# Patient Record
Sex: Male | Born: 1965 | Race: White | Hispanic: No | Marital: Married | Smoking: Never smoker
Health system: Southern US, Community
[De-identification: ages and names within clinical notes are randomized; demographics above are authoritative.]

## PROBLEM LIST (undated history)

## (undated) DIAGNOSIS — I4819 Other persistent atrial fibrillation: Secondary | ICD-10-CM

## (undated) DIAGNOSIS — K219 Gastro-esophageal reflux disease without esophagitis: Secondary | ICD-10-CM

## (undated) DIAGNOSIS — K589 Irritable bowel syndrome without diarrhea: Secondary | ICD-10-CM

## (undated) DIAGNOSIS — I071 Rheumatic tricuspid insufficiency: Secondary | ICD-10-CM

## (undated) HISTORY — DX: Rheumatic tricuspid insufficiency: I07.1

## (undated) HISTORY — DX: Irritable bowel syndrome, unspecified: K58.9

## (undated) HISTORY — DX: Other persistent atrial fibrillation: I48.19

---

## 1996-07-01 HISTORY — PX: ANKLE FRACTURE SURGERY: SHX122

## 1996-07-01 HISTORY — PX: TIBIA FRACTURE SURGERY: SHX806

## 1997-12-21 ENCOUNTER — Emergency Department (HOSPITAL_COMMUNITY): Admission: EM | Admit: 1997-12-21 | Discharge: 1997-12-21 | Payer: Self-pay | Admitting: Emergency Medicine

## 1998-09-07 ENCOUNTER — Emergency Department (HOSPITAL_COMMUNITY): Admission: EM | Admit: 1998-09-07 | Discharge: 1998-09-07 | Payer: Self-pay | Admitting: Emergency Medicine

## 1998-09-07 ENCOUNTER — Encounter: Payer: Self-pay | Admitting: Emergency Medicine

## 1999-10-27 ENCOUNTER — Ambulatory Visit (HOSPITAL_COMMUNITY): Admission: RE | Admit: 1999-10-27 | Discharge: 1999-10-27 | Payer: Self-pay | Admitting: Gastroenterology

## 1999-10-27 ENCOUNTER — Encounter: Payer: Self-pay | Admitting: Gastroenterology

## 2004-09-12 ENCOUNTER — Emergency Department (HOSPITAL_COMMUNITY): Admission: EM | Admit: 2004-09-12 | Discharge: 2004-09-13 | Payer: Self-pay | Admitting: Emergency Medicine

## 2007-12-18 ENCOUNTER — Encounter: Admission: RE | Admit: 2007-12-18 | Discharge: 2007-12-18 | Payer: Self-pay | Admitting: Gastroenterology

## 2009-07-10 ENCOUNTER — Encounter: Admission: RE | Admit: 2009-07-10 | Discharge: 2009-07-10 | Payer: Self-pay | Admitting: Gastroenterology

## 2010-12-11 ENCOUNTER — Ambulatory Visit (HOSPITAL_COMMUNITY)
Admission: RE | Admit: 2010-12-11 | Discharge: 2010-12-11 | Disposition: A | Discharge status: Home / Self Care | Payer: PRIVATE HEALTH INSURANCE | Source: Ambulatory Visit | Attending: Cardiology | Admitting: Cardiology

## 2010-12-11 DIAGNOSIS — I4891 Unspecified atrial fibrillation: Secondary | ICD-10-CM | POA: Insufficient documentation

## 2010-12-11 DIAGNOSIS — Z7901 Long term (current) use of anticoagulants: Secondary | ICD-10-CM | POA: Insufficient documentation

## 2010-12-25 NOTE — Op Note (Signed)
  NAMEGLEASON, Chad Braun NO.:  [id number]  MEDICAL RECORD NO.:  [id number]  LOCATION:  [facility]                         FACILITY:  [facility]  PHYSICIAN:  Armanda Magic, M.D.     DATE OF BIRTH:  06/28/1966  DATE OF PROCEDURE:  12/11/2010 DATE OF DISCHARGE:  12/11/2010                              OPERATIVE REPORT   REFERRING PHYSICIAN:  Hessie Diener (C.Alan) Tenny Craw, MD  PROCEDURE:  Direct current cardioversion.  OPERATOR:  Armanda Magic, MD  INDICATIONS:  Atrial fibrillation off Pradaxa since October 03, 2010.  The patient states he has numbness in doses.  COMPLICATIONS:  None.  IV MEDICATIONS:  Propofol 160 mg.  This is a 45 year old male with a history of paroxysmal atrial fibrillation in the past usually only occurring during acute illnesses but recently has had recurrence of atrial fibrillation.  He was recently placed on Pradaxa 150 mg b.i.d. on October 03, 2010, and has not missed any doses.  He now presents for direct current cardioversion.  The patient is brought to the day hospital in a fasting nonsedated state.  Informed consent was obtained.  The patient was connected to continuous heart rate pulse oximetry monitoring and blood pressure monitoring.  Defibrillator pads were placed on the left anterior chest and back.  After adequate anesthesia was obtained, a 150 joule synchronized biphasic shock was delivered which successfully converted the patient to sinus rhythm.  Few minutes after conversion, the patient then reverted back to atrial fibrillation.  The patient tolerated the procedure well without complications.  ASSESSMENT: 1. Atrial fibrillation. 2. Systemic anticoagulation on Pradaxa x 2 months. 3. Successful cardioversion to sinus rhythm with revision back to     atrial fibrillation several months later.  PLAN:  We will continue metoprolol and Pradaxa.  We will get an outpatient nuclear stress test in 1 week to rule out ischemia.  The patient only had an EPT  back in December 2011, and before placing on flecainide would like to get a nuclear stress test to make sure there is normal perfusion.  Nuclear stress test was normal.  We will start flecainide 50 mg b.i.d.  He will follow up with me in 2 weeks.     Armanda Magic, M.D.     TT/MEDQ  D:  12/11/2010  T:  12/12/2010  Job:  [id number]  cc:   Magnus Sinning) Tenny Craw, M.D.  Electronically Signed by Armanda Magic M.D. on 12/25/2010 09:59:06 PM

## 2011-01-21 ENCOUNTER — Inpatient Hospital Stay (HOSPITAL_BASED_OUTPATIENT_CLINIC_OR_DEPARTMENT_OTHER)
Admission: RE | Admit: 2011-01-21 | Discharge: 2011-01-21 | Disposition: A | Discharge status: Home / Self Care | Payer: PRIVATE HEALTH INSURANCE | Source: Ambulatory Visit | Attending: Cardiology | Admitting: Cardiology

## 2011-01-21 HISTORY — PX: CARDIAC CATHETERIZATION: SHX172

## 2011-01-31 NOTE — Cardiovascular Report (Signed)
NAMESHIVAAY, STORMONT             ACCOUNT NO.:  [id number]  MEDICAL RECORD NO.:  [id number]  LOCATION:                                 FACILITY:  PHYSICIAN:  Armanda Magic, M.D.     DATE OF BIRTH:  1966-02-22  DATE OF PROCEDURE:  01/21/2011 DATE OF DISCHARGE:                           CARDIAC CATHETERIZATION   PROCEDURES: 1. Left heart catheterization. 2. Coronary angiography. 3. Left ventriculography.  OPERATOR:  Armanda Magic, MD  MEDICATIONS:  Abnormal nuclear stress test done for drug-eluting flecainide.  INTRAVENOUS MEDICATIONS:  Versed 2 mg and fentanyl 25 mcg.  COMPLICATIONS:  None.  INTRAVENOUS ACCESS:  Via right femoral artery 4-French sheath.  This is a 45 year old white male with a history of atrial fibrillation who has undergone cardioversion with recurrence of atrial fibrillation. We are now contemplating adding flecainide.  He underwent nuclear stress test prior to adding flecainide which showed a reversible defect in the apex.  He now presents for cardiac catheterization.  The patient was brought to the Cardiac Catheterization Laboratory in a fasting nonsedated state.  Informed consent was obtained.  The patient was connected to continuous heart rate and pulse oximetry monitoring and intermittent blood pressure monitoring.  The right groin was prepped and draped in a sterile fashion.  Xylocaine 1% was used for local anesthesia.  Using modified Seldinger technique, a 4-French sheath was placed in the right femoral artery.  Under fluoroscopic guidance, a 4- [place] JL-4 catheter was placed in the left coronary artery.  Multiple cine films were taken at 30-degree RAO and 40-degree LAO views.  This catheter was then exchanged out over a guidewire for 4-French 3-D RCA catheter which successfully engaged the right coronary ostium.  Multiple cine films were taken at 30-degree RAO and 40-degree LAO views.  This catheter was then exchanged over a guidewire for a  4-French angled pigtail catheter which was placed under fluoroscopic guidance in the left ventricular cavity.  Left ventriculography was performed in 30- degree RAO view using a total of 25 mL of contrast at 12 mL per second. The catheter was then pulled back across the aortic valve with no significant gradient noted.  At the end of the procedure, all catheters and sheaths were removed.  Manual compression was performed until adequate hemostasis was obtained.  The patient was transferred back to room in stable condition.  RESULTS: 1. Left main coronary artery is actually a dual-ostial LAD and left     circumflex.  Left anterior descending artery is widely patent     throughout its course of the apex.  It gives rise to a first     diagonal which is very large and widely patent and bifurcates into     2 daughter vessels.  It then gives rise to a second diagonal which     is widely patent and a third diagonal which is widely patent as     well.  The left circumflex is widely patent throughout its course     and distally bifurcates into 2 daughter vessels both of which are     widely patent.  The right coronary artery is widely patent and bifurcates distally into a posterior descending  artery and a posterolateral artery both of which are widely patent.  Left ventriculography shows questionable mild LV dysfunction, EF of around 50, but the patient was in AFib going very fast at 120 beats per minute.  It is difficult to assess LV function. 1. Normal coronary artery.  PLAN:  We will continue Toprol and add digoxin 0.125 mg daily for rate control.  I will get an outpatient echo to reassess LV function before starting flecainide.  He will restart his Pradaxa today.     Armanda Magic, M.D.     TT/MEDQ  D:  01/21/2011  T:  01/21/2011  Job:  [id number]  cc:   Magnus Sinning) Tenny Craw, M.D.  Electronically Signed by Armanda Magic M.D. on 01/31/2011 08:53:23 AM

## 2011-02-18 ENCOUNTER — Encounter: Payer: Self-pay | Admitting: Internal Medicine

## 2011-02-19 ENCOUNTER — Ambulatory Visit (INDEPENDENT_AMBULATORY_CARE_PROVIDER_SITE_OTHER): Payer: PRIVATE HEALTH INSURANCE | Admitting: Internal Medicine

## 2011-02-19 ENCOUNTER — Encounter: Payer: Self-pay | Admitting: Internal Medicine

## 2011-02-19 VITALS — BP 114/72 | HR 86 | Ht 70.0 in | Wt 212.0 lb

## 2011-02-19 DIAGNOSIS — I519 Heart disease, unspecified: Secondary | ICD-10-CM

## 2011-02-19 DIAGNOSIS — I4891 Unspecified atrial fibrillation: Secondary | ICD-10-CM

## 2011-02-19 DIAGNOSIS — Z7901 Long term (current) use of anticoagulants: Secondary | ICD-10-CM

## 2011-02-19 MED ORDER — FLECAINIDE ACETATE 100 MG PO TABS: 100.0000 mg | ORAL_TABLET | Freq: Two times a day (BID) | ORAL | Status: DC

## 2011-02-19 NOTE — Assessment & Plan Note (Signed)
The patient is symptomatic. His [place] score is 0. He has an EF=50% by echo. He has no CHF symptoms. I have reviewed his ECG and have recommended starting flecainide 100 mg twice daily, and stopping digoxin. He will undergo exercise treadmill testing in 2 weeks. I have asked that he not undergo any strenuous activity until he has undergone stress testing. He would be a good candidate for atrial fib ablation but this would only be indicated if he fails an anti-arrhythmic drug. Once we have gotten him back into NSR, I would stop pradaxa after 3-4 weeks.

## 2011-02-19 NOTE — Patient Instructions (Addendum)
Your physician has recommended you make the following change in your medication: Stop your Digoxin, continue your Metoprolol and start Flecanide 100mg  twice daily  Your physician has requested that you have an exercise tolerance test. For further information please visit [email or web address] Please also follow instruction sheet, as given.

## 2011-02-19 NOTE — Progress Notes (Signed)
HPI Chad Braun is referred today by Dr. Mayford Knife for evaluation of persistent atrial fibrillation. The patient is otherwise healthy. He has a h/o atrial fib dating back to 1998 when he fractured his leg. He had no recurrence until 14 months ago when he spontaneously converted on toprol. In April, he had recurrent fibrillation and experienced ERAF after DCCV. He was noted to have mild LV dysfunction by cath and echo (EF=50%) and no CAD. He denies CHF symptoms. He has never had syncope. He notes that in atrial fib, he gets tired easier and cannot perform strenuous activity. He denies peripheral edema. After uptitration of his toprol, his palpitations have improved. No Known Allergies   Current Outpatient Prescriptions  Medication Sig Dispense Refill  . dabigatran (PRADAXA) 150 MG CAPS Take 150 mg by mouth 2 (two) times daily.        . digoxin (LANOXIN) 0.125 MG tablet Take 125 mcg by mouth daily.        Marland Kitchen omeprazole (PRILOSEC) 20 MG capsule Take 20 mg by mouth 2 (two) times daily.        . metoprolol (TOPROL-XL) 50 MG 24 hr tablet Take 50 mg by mouth Daily.         Past Medical History  Diagnosis Date  . Atrial fibrillation   . IBS (irritable bowel syndrome)   . Mild tricuspid regurgitation     ROS:   All systems reviewed and negative except as noted in the HPI.   Past Surgical History  Procedure Date  . Cardioversion      History reviewed. No pertinent family history.   History   Social History  . Marital Status: Married    Spouse Name: N/A    Number of Children: N/A  . Years of Education: N/A   Occupational History  .      Engineer, structural   Social History Main Topics  . Smoking status: Never Smoker   . Smokeless tobacco: Not on file  . Alcohol Use: Yes     one per week  . Drug Use: No  . Sexually Active: Not on file   Other Topics Concern  . Not on file   Social History Narrative  . No narrative on file     BP 114/72  Pulse 86  Ht 5'  10" (1.778 m)  Wt 212 lb (96.163 kg)  BMI 30.42 kg/m2  Physical Exam:  Well appearing NAD HEENT: Unremarkable Neck:  No JVD, no thyromegally Lymphatics:  No adenopathy Back:  No CVA tenderness Lungs:  Clear with no wheezes, rales, or rhonchi HEART:  Iregular rate rhythm, no murmurs, no rubs, no clicks Abd:  soft, positive bowel sounds, no organomegally, no rebound, no guarding Ext:  2 plus pulses, no edema, no cyanosis, no clubbing Skin:  No rashes no nodules Neuro:  CN II through XII intact, motor grossly intact  EKG Atrial fibrillation with a controlled ventricular response  Assess/Plan:

## 2011-03-14 ENCOUNTER — Encounter: Payer: Self-pay | Admitting: Internal Medicine

## 2011-03-15 ENCOUNTER — Ambulatory Visit (INDEPENDENT_AMBULATORY_CARE_PROVIDER_SITE_OTHER): Payer: PRIVATE HEALTH INSURANCE | Admitting: Internal Medicine

## 2011-03-15 DIAGNOSIS — I4891 Unspecified atrial fibrillation: Secondary | ICD-10-CM

## 2011-03-15 MED ORDER — DABIGATRAN ETEXILATE MESYLATE 150 MG PO CAPS: 150.0000 mg | ORAL_CAPSULE | Freq: Two times a day (BID) | ORAL | Status: DC

## 2011-03-15 NOTE — Patient Instructions (Addendum)
Patient wants to come in on 03/21/2011 for Tikosyn load

## 2011-03-15 NOTE — Progress Notes (Signed)
Exercise Treadmill Test  Pre-Exercise Testing Evaluation Rhythm: atrial fibrillation  Rate: 123   QT:  27 QTc: .39     Test  Exercise Tolerance Test Ordering MD: Lewayne Bunting, MD  Interpreting MD:  Lewayne Bunting, MD  Unique Test No: 1  Treadmill:  1  Indication for ETT: A-FIB  Contraindication to ETT: No   Stress Modality: exercise - treadmill  Cardiac Imaging Performed: non   Protocol: standard Bruce - maximal  Max BP:  118/63  Max MPHR (bpm):  177 85% MPR (bpm):  150  MPHR obtained (bpm):203 % MPHR obtained:  112%  Reached 85% MPHR (min:sec):6:00 Total Exercise Time (min-sec):  11:13  Workload in METS:  13.5 Borg Scale:15  Reason ETT Terminated:  Non-sustained VT    ST Segment Analysis At Rest: normal ST segments - no evidence of significant ST depression With Exercise: no evidence of significant ST depression  Other Information Arrhythmia:  Yes Angina during ETT:  absent (0) Quality of ETT:  diagnostic  ETT Interpretation:  normal - no evidence of ischemia by ST analysis  Comments: Because of NSVT during exercise, will plan to stop flecainide and continue rate control vs additional anti-arrhythmic drug Rx vs Atrial fib ablation.\  Recommendations: See above.

## 2011-03-21 ENCOUNTER — Encounter (INDEPENDENT_AMBULATORY_CARE_PROVIDER_SITE_OTHER): Payer: PRIVATE HEALTH INSURANCE

## 2011-03-21 ENCOUNTER — Inpatient Hospital Stay (HOSPITAL_COMMUNITY)
Admission: AD | Admit: 2011-03-21 | Discharge: 2011-03-24 | DRG: 310 | Disposition: A | Discharge status: Home / Self Care | Payer: PRIVATE HEALTH INSURANCE | Source: Ambulatory Visit | Attending: Internal Medicine | Admitting: Internal Medicine

## 2011-03-21 ENCOUNTER — Ambulatory Visit (HOSPITAL_COMMUNITY)
Admission: RE | Admit: 2011-03-21 | Payer: PRIVATE HEALTH INSURANCE | Source: Ambulatory Visit | Admitting: Internal Medicine

## 2011-03-21 ENCOUNTER — Encounter: Payer: Self-pay | Admitting: Internal Medicine

## 2011-03-21 VITALS — BP 110/78 | Ht 70.0 in | Wt 216.0 lb

## 2011-03-21 DIAGNOSIS — E876 Hypokalemia: Secondary | ICD-10-CM | POA: Diagnosis present

## 2011-03-21 DIAGNOSIS — I4891 Unspecified atrial fibrillation: Secondary | ICD-10-CM

## 2011-03-21 DIAGNOSIS — K589 Irritable bowel syndrome without diarrhea: Secondary | ICD-10-CM | POA: Diagnosis present

## 2011-03-21 DIAGNOSIS — I079 Rheumatic tricuspid valve disease, unspecified: Secondary | ICD-10-CM | POA: Diagnosis present

## 2011-03-21 DIAGNOSIS — Z7901 Long term (current) use of anticoagulants: Secondary | ICD-10-CM

## 2011-03-21 DIAGNOSIS — T462X5A Adverse effect of other antidysrhythmic drugs, initial encounter: Secondary | ICD-10-CM | POA: Diagnosis present

## 2011-03-21 LAB — BASIC METABOLIC PANEL
CO2: 27 mEq/L (ref 19–32)
Calcium: 9 mg/dL (ref 8.4–10.5)
Chloride: 104 mEq/L (ref 96–112)
Potassium: 4.1 mEq/L (ref 3.5–5.3)
Sodium: 138 mEq/L (ref 135–145)

## 2011-03-21 LAB — MAGNESIUM: Magnesium: 2 mg/dL (ref 1.5–2.5)

## 2011-03-21 NOTE — Progress Notes (Signed)
HPI  Mr. Grizzell is a 45 yr old male who is followed by Dr. Ladona Ridgel for atrial fibrillation.   This encounter was created in error - please disregard.

## 2011-03-22 LAB — BASIC METABOLIC PANEL
CO2: 25 mEq/L (ref 19–32)
Chloride: 106 mEq/L (ref 96–112)
Creatinine, Ser: 0.81 mg/dL (ref 0.50–1.35)
GFR calc Af Amer: >60 mL/min (ref >60)
Potassium: 3.9 mEq/L (ref 3.5–5.1)

## 2011-03-22 LAB — MAGNESIUM: Magnesium: 2 mg/dL (ref 1.5–2.5)

## 2011-03-23 LAB — COMPREHENSIVE METABOLIC PANEL
ALT: 18 U/L (ref 0–53)
AST: 18 U/L (ref 0–37)
Albumin: 3.4 g/dL — ABNORMAL LOW (ref 3.5–5.2)
Alkaline Phosphatase: 105 U/L (ref 39–117)
CO2: 25 mEq/L (ref 19–32)
Chloride: 104 mEq/L (ref 96–112)
GFR calc non Af Amer: >60 mL/min (ref >60)
Potassium: 3.9 mEq/L (ref 3.5–5.1)
Total Bilirubin: 0.4 mg/dL (ref 0.3–1.2)

## 2011-03-24 LAB — COMPREHENSIVE METABOLIC PANEL
ALT: 19 U/L (ref 0–53)
AST: 18 U/L (ref 0–37)
CO2: 29 mEq/L (ref 19–32)
Calcium: 8.9 mg/dL (ref 8.4–10.5)
Chloride: 104 mEq/L (ref 96–112)
Creatinine, Ser: 0.94 mg/dL (ref 0.50–1.35)
GFR calc Af Amer: >60 mL/min (ref >60)
GFR calc non Af Amer: >60 mL/min (ref >60)
Glucose, Bld: 103 mg/dL — ABNORMAL HIGH (ref 70–99)
Total Bilirubin: 0.4 mg/dL (ref 0.3–1.2)

## 2011-03-26 ENCOUNTER — Telehealth: Payer: Self-pay | Admitting: Internal Medicine

## 2011-03-26 DIAGNOSIS — I4891 Unspecified atrial fibrillation: Secondary | ICD-10-CM

## 2011-04-11 ENCOUNTER — Ambulatory Visit (INDEPENDENT_AMBULATORY_CARE_PROVIDER_SITE_OTHER): Payer: PRIVATE HEALTH INSURANCE | Admitting: Internal Medicine

## 2011-04-11 ENCOUNTER — Encounter: Payer: Self-pay | Admitting: Internal Medicine

## 2011-04-11 VITALS — BP 120/84 | HR 90 | Ht 70.0 in | Wt 218.0 lb

## 2011-04-11 DIAGNOSIS — I4891 Unspecified atrial fibrillation: Secondary | ICD-10-CM

## 2011-04-11 DIAGNOSIS — Z7901 Long term (current) use of anticoagulants: Secondary | ICD-10-CM

## 2011-04-11 NOTE — Patient Instructions (Signed)

## 2011-04-11 NOTE — Progress Notes (Signed)
HPI Mr. Chad Braun returns today for followup. He is a pleasant 45 yo man with longstanding symptomatic atrial fibrillation, borderline HTN, and VT on Flecainide.  No Known Allergies   Current Outpatient Prescriptions  Medication Sig Dispense Refill  . amiodarone (PACERONE) 200 MG tablet Take 200 mg by mouth 3 (three) times daily.        . dabigatran (PRADAXA) 150 MG CAPS Take 1 capsule (150 mg total) by mouth 2 (two) times daily.  60 capsule  6  . metoprolol (TOPROL-XL) 50 MG 24 hr tablet Take 50 mg by mouth Daily.      Marland Kitchen omeprazole (PRILOSEC) 20 MG capsule Take 20 mg by mouth 2 (two) times daily.           Past Medical History  Diagnosis Date  . Atrial fibrillation   . IBS (irritable bowel syndrome)   . Mild tricuspid regurgitation     ROS:   All systems reviewed and negative except as noted in the HPI.   Past Surgical History  Procedure Date  . Cardioversion      No family history on file.   History   Social History  . Marital Status: Married    Spouse Name: N/A    Number of Children: N/A  . Years of Education: N/A   Occupational History  .      Engineer, structural   Social History Main Topics  . Smoking status: Never Smoker   . Smokeless tobacco: Not on file  . Alcohol Use: Yes     one per week  . Drug Use: No  . Sexually Active: Not on file   Other Topics Concern  . Not on file   Social History Narrative  . No narrative on file     BP 120/84  Pulse 90  Ht 5\' 10"  (1.778 m)  Wt 218 lb (98.884 kg)  BMI 31.28 kg/m2  Physical Exam:  Well appearing 45 yo man, NAD HEENT: Unremarkable Neck:  No JVD, no thyromegally Lymphatics:  No adenopathy Back:  No CVA tenderness Lungs:  Clear with no wheezes, rales, rhonchi.  HEART:  Regular rate rhythm, no murmurs, no rubs, no clicks Abd:  soft, positive bowel sounds, no organomegally, no rebound, no guarding Ext:  2 plus pulses, no edema, no cyanosis, no clubbing Skin:  No rashes no  nodules Neuro:  CN II through XII intact, motor grossly intact  EKG Atrial fibrillation  Assess/Plan:

## 2011-04-11 NOTE — Assessment & Plan Note (Signed)
His ventricular rate is well controlled. We will continue his amiodarone and plan to proceed with DCCV once his amiodarone has been loaded.

## 2011-04-11 NOTE — Assessment & Plan Note (Signed)
He will continue Pradaxa.

## 2011-04-15 ENCOUNTER — Encounter: Payer: Self-pay | Admitting: *Deleted

## 2011-04-19 ENCOUNTER — Other Ambulatory Visit (INDEPENDENT_AMBULATORY_CARE_PROVIDER_SITE_OTHER): Payer: PRIVATE HEALTH INSURANCE | Admitting: *Deleted

## 2011-04-19 DIAGNOSIS — I4891 Unspecified atrial fibrillation: Secondary | ICD-10-CM

## 2011-04-19 LAB — CBC WITH DIFFERENTIAL/PLATELET
Basophils Absolute: 0 10*3/uL (ref 0.0–0.1)
Eosinophils Absolute: 0.1 10*3/uL (ref 0.0–0.7)
Lymphocytes Relative: 36.4 % (ref 12.0–46.0)
MCHC: 34.4 g/dL (ref 30.0–36.0)
MCV: 93.6 fl (ref 78.0–100.0)
Monocytes Absolute: 0.3 10*3/uL (ref 0.1–1.0)
Neutrophils Relative %: 55 % (ref 43.0–77.0)
Platelets: 193 10*3/uL (ref 150.0–400.0)
WBC: 4.8 10*3/uL (ref 4.5–10.5)

## 2011-04-19 LAB — BASIC METABOLIC PANEL
Chloride: 105 mEq/L (ref 96–112)
GFR: 79.8 mL/min (ref >60.00)
Potassium: 3.9 mEq/L (ref 3.5–5.1)
Sodium: 139 mEq/L (ref 135–145)

## 2011-04-24 NOTE — Discharge Summary (Signed)
  [name], [name] NO.:  [id number]  MEDICAL RECORD NO.:  [id number]  LOCATION:  3702                         FACILITY:  [facility]  PHYSICIAN:  Duke Salvia, MD, FACCDATE OF BIRTH:  [birth year]  DATE OF ADMISSION:  03/21/2011 DATE OF DISCHARGE:  03/24/2011                              DISCHARGE SUMMARY   PROCEDURES:  None.  PRIMARY FINAL DISCHARGE DIAGNOSIS:  Atrial fibrillation with rapid ventricular response.  SECONDARY DIAGNOSES: 1. Anticoagulation with Pradaxa. 2. Irritable bowel syndrome. 3. Mild tricuspid regurgitation. 4. Status post echocardiogram in 2011 showing an ejection fraction of     60%. 5. Status post cardiac catheterization in July 2012 showing no     coronary artery disease and an ejection fraction of about 50%. 6. Intolerance to flecainide with side effects and intolerance to     Tikosyn with nonsustained ventricular tachycardia.  TIME OF DISCHARGE:  38 minutes. HOSPITAL COURSE:  Mr. Sigmund is a 45 year old male with a history of atrial fibrillation with rapid ventricular response.  He was evaluated by Dr. Ladona Ridgel and started on flecainide but did not tolerate it with significant side effects.  The flecainide was discontinued and he was admitted for Tikosyn load on March 21, 2011.  He developed nonsustained VT after the Tikosyn was initiated.  His potassium was supplemented as needed but he continued to have rapid AFib and nonsustained VT.  On March 24, 2011, he was seen by Dr. Graciela Husbands who recommended discontinuation of Tikosyn and initiation of amiodarone. He will follow up with Dr. Ladona Ridgel as an outpatient.  DISCHARGE INSTRUCTIONS:  His activity level is to be increased gradually.  He is encouraged to stick to a low-sodium heart-healthy diet.  He is to follow up with Dr. Ladona Ridgel and our office will call him with an appointment.  He is to follow up with Dr. Mayford Knife as needed or as scheduled.  He is to follow up with Dr. Tenny Craw  as needed or as scheduled.  DISCHARGE MEDICATIONS: 1. Pradaxa 150 mg b.i.d. 2. Effexor 150 mg a.m. and 75 mg p.m. 3. Toprol-XL 25 mg is discontinued. 4. Toprol-XL 50 mg a day. 5. MiraLax p.r.n. 6. Prilosec 20 mg b.i.d. 7. Amiodarone 200 mg q.8 h. 8. Stop digoxin if on at time.     Theodore Demark, PA-C   ______________________________ Duke Salvia, MD, [facility]    RB/MEDQ  D:  03/24/2011  T:  03/24/2011  Job:  [id number]  cc:   Magnus Sinning) Tenny Craw, M.D. Armanda Magic, M.D.  Electronically Signed by Theodore Demark PA-C on 04/08/2011 06:47:10 AM Electronically Signed by Sherryl Manges MD [facility] on 04/24/2011 11:25:00 AM

## 2011-04-25 ENCOUNTER — Telehealth: Payer: Self-pay | Admitting: Internal Medicine

## 2011-04-25 NOTE — Telephone Encounter (Signed)
Pt aware of inatructions

## 2011-04-25 NOTE — Telephone Encounter (Signed)
Pt having cardioversion tomorrow, would like to go over instructions again

## 2011-04-26 ENCOUNTER — Ambulatory Visit (HOSPITAL_COMMUNITY)
Admission: RE | Admit: 2011-04-26 | Discharge: 2011-04-26 | Disposition: A | Discharge status: Home / Self Care | Payer: PRIVATE HEALTH INSURANCE | Source: Ambulatory Visit | Attending: Internal Medicine | Admitting: Internal Medicine

## 2011-04-26 DIAGNOSIS — I4891 Unspecified atrial fibrillation: Secondary | ICD-10-CM | POA: Insufficient documentation

## 2011-04-26 DIAGNOSIS — K219 Gastro-esophageal reflux disease without esophagitis: Secondary | ICD-10-CM | POA: Insufficient documentation

## 2011-04-26 DIAGNOSIS — Z01810 Encounter for preprocedural cardiovascular examination: Secondary | ICD-10-CM | POA: Insufficient documentation

## 2011-04-26 DIAGNOSIS — K589 Irritable bowel syndrome without diarrhea: Secondary | ICD-10-CM | POA: Insufficient documentation

## 2011-04-28 NOTE — Op Note (Signed)
  Chad Braun, SAILORS NO.:  [id number]  MEDICAL RECORD NO.:  [id number]  LOCATION:  [facility]                         FACILITY:  [facility]  PHYSICIAN:  Doylene Canning. Ladona Ridgel, MD    DATE OF BIRTH:  January 10, 1966  DATE OF PROCEDURE:  04/26/2011 DATE OF DISCHARGE:  04/26/2011                              OPERATIVE REPORT   PROCEDURE PERFORMED:  DC cardioversion.  INDICATION:  Symptomatic atrial fibrillation.  INTRODUCTION:  The patient is a 45 year old man with persistent (prolonged) atrial fibrillation.  He is status post multiple attempts of cardioversion in the past.  He is now on amiodarone and has been loaded and is now referred for DC cardioversion.  He has been on Pradaxa.  PROCEDURE:  After informed was obtained, the patient was prepped in the usual manner.  The electrodispersive pad was placed in the anterior and posterior position.  He was given 200 mg sodium thiopental under the direction of Dr. Jean Rosenthal, 200 joules of synchronized DC cardioversion was delivered to the anterior and posterior electrodispersive pads restoring in sinus rhythm.  The patient tolerated the procedure well. He was allowed to awaken in the usual manner.  COMPLICATIONS:  There were no immediate procedure complications.  RESULTS:  This demonstrates successful DC cardioversion of patient with persistent atrial fibrillation.     Doylene Canning. Ladona Ridgel, MD     GWT/MEDQ  D:  04/26/2011  T:  04/26/2011  Job:  [id number]  Electronically Signed by Lewayne Bunting MD on 04/28/2011 12:21:31 PM

## 2011-05-07 ENCOUNTER — Telehealth: Payer: Self-pay | Admitting: Internal Medicine

## 2011-05-07 NOTE — Telephone Encounter (Signed)
Pt calling re cardioverted 10-26 and and told he would see him in two weeks an appt never made, he thought he was to discuss change of meds, can scott see him? He's only  Here 11-8 and next Friday 11-16 booked both days

## 2011-05-08 NOTE — Telephone Encounter (Signed)
Will see Chad Braun  Same day Dr Ladona Ridgel is in the office [facility]

## 2011-05-15 ENCOUNTER — Ambulatory Visit (INDEPENDENT_AMBULATORY_CARE_PROVIDER_SITE_OTHER): Payer: PRIVATE HEALTH INSURANCE | Admitting: Physician Assistant

## 2011-05-15 ENCOUNTER — Encounter: Payer: Self-pay | Admitting: Physician Assistant

## 2011-05-15 VITALS — BP 112/70 | HR 46 | Ht 70.0 in | Wt 221.0 lb

## 2011-05-15 DIAGNOSIS — I4891 Unspecified atrial fibrillation: Secondary | ICD-10-CM

## 2011-05-15 DIAGNOSIS — I519 Heart disease, unspecified: Secondary | ICD-10-CM

## 2011-05-15 LAB — TSH: TSH: 4.311 u[IU]/mL (ref 0.350–4.500)

## 2011-05-15 LAB — HEPATIC FUNCTION PANEL
Bilirubin, Direct: 0.1 mg/dL (ref 0.0–0.3)
Indirect Bilirubin: 0.5 mg/dL (ref 0.0–0.9)
Total Bilirubin: 0.6 mg/dL (ref 0.3–1.2)
Total Protein: 6.8 g/dL (ref 6.0–8.3)

## 2011-05-15 NOTE — Progress Notes (Signed)
History of Present Illness: Primary Electrophysiologist:  Dr. Lewayne Bunting   Chad Braun is a 45 y.o. male who presents for f/u post DCCV.  He was originally seen by Dr. Lewayne Bunting in 8/12 for persistent AFib.  He has a h/o normal cors by cath in 7/12. Echo has demonstrated his EF 50%.  He was put on Flecainide.  He developed NSVT with ETT.  This was stopped and he was admitted for Tikosyn load in 9/12.  He developed NSVT with this as well and was changed to amiodarone.  He was adequately loaded and was maintained on Pradaxa.  He came in for elective DCCV 10/26.  He returns for follow up.  Feels well aside from fatigue.  The patient denies chest pain, shortness of breath, syncope, orthopnea, PND or significant pedal edema.  He had some "flutters" after eating one night.  No symptoms reminiscent of Afib.  Past Medical History  Diagnosis Date  . Atrial fibrillation   . IBS (irritable bowel syndrome)   . Mild tricuspid regurgitation     Current Outpatient Prescriptions  Medication Sig Dispense Refill  . amiodarone (PACERONE) 200 MG tablet Take 2 tablets (400 mg total) by mouth daily.      . dabigatran (PRADAXA) 150 MG CAPS Take 1 capsule (150 mg total) by mouth 2 (two) times daily.  60 capsule  6  . metoprolol (TOPROL-XL) 50 MG 24 hr tablet Take 0.5 tablets (25 mg total) by mouth daily.      Marland Kitchen omeprazole (PRILOSEC) 20 MG capsule Take by mouth 2 (two) times daily.       Marland Kitchen DISCONTD: metoprolol (TOPROL-XL) 50 MG 24 hr tablet Take 50 mg by mouth Daily.        Allergies: No Known Allergies  History  Substance Use Topics  . Smoking status: Never Smoker   . Smokeless tobacco: Not on file  . Alcohol Use: Yes     one per week      Vital Signs: BP 112/70  Pulse 46  Ht 5\' 10"  (1.778 m)  Wt 221 lb (100.245 kg)  BMI 31.71 kg/m2  PHYSICAL EXAM: Well nourished, well developed, in no acute distress HEENT: normal Neck: no JVD Cardiac:  normal S1, S2; RRR; no murmur Lungs:  clear to  auscultation bilaterally, no wheezing, rhonchi or rales Abd: soft, nontender, no hepatomegaly Ext: no edema Skin: warm and dry Neuro:  CNs 2-12 intact, no focal abnormalities noted  EKG:   Sinus brady, HR 46, normal axis, no ischemic changes  ASSESSMENT AND PLAN:

## 2011-05-15 NOTE — Assessment & Plan Note (Signed)
Maintaining NSR on amiodarone.  He remains on Pradaxa.  The patient was under the impression he would be referred to Dr. Hillis Range.  I discussed with Dr. Hillis Range and the patient will be set up for initial consultation in follow up.  He is bradycardic and somewhat symptomatic.  Decrease amiodarone to 400 mg QD.  Decrease Toprol to 25 mg QD.  Check TSH and LFTs today.  Follow up as noted.

## 2011-05-15 NOTE — Patient Instructions (Signed)
You have been referred to DR. ALLRED PER SCOTT WEAVER, PA=C FOR A-FIB 427.31  Your physician has recommended you make the following change in your medication: DECREASE AMIODARONE TO 400 MG DAILY; DECREASE TOPROL XL TO 25 MG DAILY  YOU HAVE A NURSE VISIT 05/29/11 TO HAVE AN EKG @ 10 AM @ [address]  Your physician recommends that you return for lab work in: TODAY TSH, LFT

## 2011-05-27 ENCOUNTER — Telehealth: Payer: Self-pay | Admitting: Internal Medicine

## 2011-05-27 NOTE — Telephone Encounter (Signed)
New problem Pt wanted to talk to you about this appt

## 2011-05-27 NOTE — Telephone Encounter (Signed)
Patient is coming for an EKG  On 05/29/11 after 3:30pm

## 2011-05-29 ENCOUNTER — Encounter (INDEPENDENT_AMBULATORY_CARE_PROVIDER_SITE_OTHER): Payer: PRIVATE HEALTH INSURANCE

## 2011-07-17 ENCOUNTER — Emergency Department (HOSPITAL_COMMUNITY): Payer: Worker's Compensation

## 2011-07-17 ENCOUNTER — Encounter (HOSPITAL_COMMUNITY): Payer: Self-pay

## 2011-07-17 ENCOUNTER — Emergency Department (HOSPITAL_COMMUNITY)
Admission: EM | Admit: 2011-07-17 | Discharge: 2011-07-17 | Disposition: A | Discharge status: Home / Self Care | Payer: Worker's Compensation | Attending: Emergency Medicine | Admitting: Emergency Medicine

## 2011-07-17 DIAGNOSIS — S0100XA Unspecified open wound of scalp, initial encounter: Secondary | ICD-10-CM | POA: Insufficient documentation

## 2011-07-17 DIAGNOSIS — Z23 Encounter for immunization: Secondary | ICD-10-CM | POA: Insufficient documentation

## 2011-07-17 DIAGNOSIS — IMO0002 Reserved for concepts with insufficient information to code with codable children: Secondary | ICD-10-CM | POA: Insufficient documentation

## 2011-07-17 DIAGNOSIS — S0990XA Unspecified injury of head, initial encounter: Secondary | ICD-10-CM | POA: Insufficient documentation

## 2011-07-17 DIAGNOSIS — Y9269 Other specified industrial and construction area as the place of occurrence of the external cause: Secondary | ICD-10-CM | POA: Insufficient documentation

## 2011-07-17 DIAGNOSIS — S0101XA Laceration without foreign body of scalp, initial encounter: Secondary | ICD-10-CM

## 2011-07-17 DIAGNOSIS — I059 Rheumatic mitral valve disease, unspecified: Secondary | ICD-10-CM | POA: Insufficient documentation

## 2011-07-17 DIAGNOSIS — K589 Irritable bowel syndrome without diarrhea: Secondary | ICD-10-CM | POA: Insufficient documentation

## 2011-07-17 DIAGNOSIS — I4891 Unspecified atrial fibrillation: Secondary | ICD-10-CM | POA: Insufficient documentation

## 2011-07-17 MED ORDER — TETANUS-DIPHTH-ACELL PERTUSSIS 5-2.5-18.5 LF-MCG/0.5 IM SUSP
0.5000 mL | Freq: Once | INTRAMUSCULAR | Status: AC
  Administered 2011-07-17: 0.5 mL via INTRAMUSCULAR
  Filled 2011-07-17: qty 0.5

## 2011-07-17 NOTE — ED Notes (Signed)
Pt back from CT.  Nad noted
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
## 2011-07-17 NOTE — ED Provider Notes (Signed)
This chart was scribed for [employer]. Colon Branch, MD by Williemae Natter. The patient was seen in room APA01/APA01 at 12:12 PM.   CSN: [id number]  Arrival date & time 07/17/11  1152   First MD Initiated Contact with Patient 07/17/11 1211      Chief Complaint  Patient presents with  . Head Injury    (Consider location/radiation/quality/duration/timing/severity/associated sxs/prior treatment) HPI Chad Braun is a 45 y.o. male with a history of atrial fibrillation who presents to the Emergency Department complaining of a head laceration. Pt was at work today when he was hit in the back of the head with a big piece of steel. Pt denies any loss of consciousness and is no apparent pain or distress. Pt stated that his afibb is often triggered by GI problems or stress. He treats the afibb with amioderone. Pt is in no pain at the moment  Cardiologist: Dr. Renard Matter  Past Medical History  Diagnosis Date  . Atrial fibrillation   . IBS (irritable bowel syndrome)   . Mild tricuspid regurgitation     Past Surgical History  Procedure Date  . Cardioversion     No family history on file.  History  Substance Use Topics  . Smoking status: Never Smoker   . Smokeless tobacco: Not on file  . Alcohol Use: Yes     one per week      Review of Systems 10 Systems reviewed and are negative for acute change except as noted in the HPI.  Allergies  Review of patient's allergies indicates no known allergies.  Home Medications   Current Outpatient Rx  Name Route Sig Dispense Refill  . AMIODARONE HCL 200 MG PO TABS Oral Take 2 tablets (400 mg total) by mouth daily.    Marland Kitchen DABIGATRAN ETEXILATE MESYLATE 150 MG PO CAPS Oral Take 1 capsule (150 mg total) by mouth 2 (two) times daily. 60 capsule 6  . METOPROLOL SUCCINATE ER 50 MG PO TB24 Oral Take 0.5 tablets (25 mg total) by mouth daily.    Marland Kitchen OMEPRAZOLE 20 MG PO CPDR Oral Take by mouth 2 (two) times daily.      Pulse oximetry on room air is 99%.  Normal by my interpretation.  BP 146/84  Pulse 61  Temp(Src) 97.9 F (36.6 C) (Oral)  Resp 16  Ht 5\' 10"  (1.778 m)  Wt 200 lb (90.719 kg)  BMI 28.70 kg/m2  SpO2 99%  Physical Exam  Nursing note and vitals reviewed. Constitutional: He is oriented to person, place, and time. He appears well-developed and well-nourished. No distress.  HENT:  Head: Normocephalic.       Laceration to back of head. Bleeding controlled.  Eyes: Conjunctivae are normal. Pupils are equal, round, and reactive to light.  Neck:       c-collar on   Cardiovascular: Normal rate, regular rhythm and normal heart sounds.   Pulmonary/Chest: Effort normal and breath sounds normal.  Neurological: He is alert and oriented to person, place, and time.  Skin: Skin is warm and dry.  Psychiatric: He has a normal mood and affect. His behavior is normal.    ED Course  Procedures (including critical care time) 1:29 PM Recheck: Scans came back normal. C-collar was taken off and laceration is being cleaned. Neck with FROM, no soft tissue tenderness.  2:00 PM Recheck: Laceration repair with 4 staples  Ct Head Wo Contrast  07/17/2011  *RADIOLOGY REPORT*  Clinical Data:  Trauma to the back of the head with  loss of consciousness n  CT HEAD WITHOUT CONTRAST CT CERVICAL SPINE WITHOUT CONTRAST  Technique:  Multidetector CT imaging of the head and cervical spine was performed following the standard protocol without intravenous contrast.  Multiplanar CT image reconstructions of the cervical spine were also generated.  Comparison:  None  CT HEAD  Findings: The brain has a normal appearance without evidence of atrophy, old or acute infarction, mass lesion, hemorrhage, hydrocephalus or extra-axial collection.  Scalp injury is noted at the left vertex.  No underlying skull fracture.  No fluid in the sinuses, middle ears or mastoids.  IMPRESSION: Normal head CT except for left parietal scalp laceration.  CT CERVICAL SPINE  Findings: Alignment is  normal.  No fracture.  No degenerative changes.  No other focal lesion.  IMPRESSION: Normal CT scan of the cervical spine  Original Report Authenticated By: Thomasenia Sales, M.D.   Ct Cervical Spine Wo Contrast  07/17/2011  *RADIOLOGY REPORT*  Clinical Data:  Trauma to the back of the head with loss of consciousness n  CT HEAD WITHOUT CONTRAST CT CERVICAL SPINE WITHOUT CONTRAST  Technique:  Multidetector CT imaging of the head and cervical spine was performed following the standard protocol without intravenous contrast.  Multiplanar CT image reconstructions of the cervical spine were also generated.  Comparison:  None  CT HEAD  Findings: The brain has a normal appearance without evidence of atrophy, old or acute infarction, mass lesion, hemorrhage, hydrocephalus or extra-axial collection.  Scalp injury is noted at the left vertex.  No underlying skull fracture.  No fluid in the sinuses, middle ears or mastoids.  IMPRESSION: Normal head CT except for left parietal scalp laceration.  CT CERVICAL SPINE  Findings: Alignment is normal.  No fracture.  No degenerative changes.  No other focal lesion.  IMPRESSION: Normal CT scan of the cervical spine  Original Report Authenticated By: Thomasenia Sales, M.D.   LACERATION REPAIR Performed by: Annamarie Dawley. Authorized by: Annamarie Dawley Consent: Verbal consent obtained. Risks and benefits: risks, benefits and alternatives were discussed Consent given by: patient Patient identity confirmed: provided demographic data Prepped and Draped in normal sterile fashion Wound explored  Laceration Location: occipitoparietal Laceration Length 8cm  No Foreign Bodies seen or palpated  Anesthesia: none  Irrigation method: syringe Amount of cleaning: standard  Skin closure: staples x 4  Patient tolerance: Patient tolerated the procedure well with no immediate complications.    MDM  Pateint hit by a piece of steel while installing a door sustaining a scalp  laceration. No LOC. CT head and cervical spine negative for acute injury.Pt stable in ED with no significant deterioration in condition.The patient appears reasonably screened and/or stabilized for discharge and I doubt any other medical condition or other [facility] requiring further screening, evaluation, or treatment in the ED at this time prior to discharge.   I personally performed the services described in this documentation, which was scribed in my presence. The recorded information has been reviewed and considered.  MDM Reviewed: nursing note and vitals Interpretation: CT scan     Nicoletta Dress. Colon Branch, MD 07/17/11 1408

## 2011-08-05 ENCOUNTER — Ambulatory Visit (INDEPENDENT_AMBULATORY_CARE_PROVIDER_SITE_OTHER): Payer: Worker's Compensation | Admitting: Internal Medicine

## 2011-08-05 ENCOUNTER — Encounter: Payer: Self-pay | Admitting: Internal Medicine

## 2011-08-05 VITALS — BP 110/58 | HR 54 | Resp 18 | Ht 70.0 in | Wt 208.1 lb

## 2011-08-05 DIAGNOSIS — I4891 Unspecified atrial fibrillation: Secondary | ICD-10-CM

## 2011-08-05 NOTE — Progress Notes (Signed)
Referring Physician:  Dr Ladona Ridgel Chad Braun is a 46 y.o. male with a h/o persistent atrial fibrillation who presents today for consideration of catheter ablation of afib.  He reports initially developing  Atrial fibrillation in 1998 after he fractured his leg. He had no recurrence until 2011 when he spontaneously converted to sinus on toprol. In April, he had recurrent fibrillation and experienced ERAF after DCCV. He was noted to have mild LV dysfunction by cath and echo (EF=50%) and no CAD.  He was placed on flecainide but had NSVT during GXT for which it was discontinued.  He was then admitted to [facility] and initiated on tikosyn.  He had NSVT while in the hospital and tikosyn was discontinued.  He was therefore placed on amiodarone and cardioverted.  He has maintained sinus rhythm since that time. He is clearly symptomatic with afib.  He reports palpitations and decreased exercise tolerance.  He feels that GI symptoms of IBS have at times been triggers for his afib. The patient is tolerating medications without difficulties and is otherwise without complaint today.   Past Medical History  Diagnosis Date  . Atrial fibrillation     persistent  . IBS (irritable bowel syndrome)   . Mild tricuspid regurgitation    Past Surgical History  Procedure Date  . Cardioversion     Current Outpatient Prescriptions  Medication Sig Dispense Refill  . amiodarone (PACERONE) 200 MG tablet Take one tablet by mouth daily.      Marland Kitchen ibuprofen (ADVIL,MOTRIN) 200 MG tablet Take 400 mg by mouth every 6 (six) hours as needed. For pain        No Known Allergies  History   Social History  . Marital Status: Married    Spouse Name: N/A    Number of Children: N/A  . Years of Education: N/A   Occupational History  .      Engineer, structural   Social History Main Topics  . Smoking status: Never Smoker   . Smokeless tobacco: Not on file  . Alcohol Use: Yes     one per week   . Drug Use: No  . Sexually Active: Not on file   Other Topics Concern  . Not on file   Social History Narrative   Works as a Contractor.    No family history on file.  ROS- All systems are reviewed and negative except as per the HPI above  Physical Exam: Filed Vitals:   08/05/11 1601  BP: 110/58  Pulse: 54  Resp: 18  Height: 5\' 10"  (1.778 m)  Weight: 208 lb 1.9 oz (94.403 kg)    GEN- The patient is well appearing, alert and oriented x 3 today.   Head- normocephalic, atraumatic Eyes-  Sclera clear, conjunctiva pink Ears- hearing intact Oropharynx- clear Neck- supple, no JVP Lymph- no cervical lymphadenopathy Lungs- Clear to ausculation bilaterally, normal work of breathing Heart- Regular rate and rhythm, no murmurs, rubs or gallops, PMI not laterally displaced GI- soft, NT, ND, + BS Extremities- no clubbing, cyanosis, or edema MS- no significant deformity or atrophy Skin- no rash or lesion Psych- euthymic mood, full affect Neuro- strength and sensation are intact  EKG today reveals sinus rhythm 54 bpm, PR 166, QRS 90, QTc 445, otherwise normal ekg Echo 7/12- EF 45-50%, mild TR, LA 35mm  Assessment and Plan:

## 2011-08-05 NOTE — Patient Instructions (Signed)
Your physician has recommended you make the following change in your medication:  1) Decrease amiodarone to 200 mg once daily.  Your physician recommends that you have lab work today: tsh/free T4/ liver  Please call Dr. Jenel Lucks nurse, Tresa Endo, if you decide to proceed with an ablation.

## 2011-08-05 NOTE — Assessment & Plan Note (Signed)
The patient has symptomatic persistent atrial fibrillation.  He has failed medical therapy with flecainide and tikosyn. He is presently maintaining sinus rhythm with amiodarone 400mg  daily.  I agree with Dr Ladona Ridgel that in this young patient, amiodarone is not an ideal medication long term.  I will decrease amiodarone to 200mg  daily today and check TFTs/ LFTs. Therapeutic strategies for afib including medicine and ablation were discussed in detail with the patient today. Risk, benefits, and alternatives to EP study and radiofrequency ablation for afib were also discussed in detail today. These risks include but are not limited to stroke, bleeding, vascular damage, tamponade, perforation, damage to the esophagus, lungs, and other structures, pulmonary vein stenosis, worsening renal function, and death. The patient understands these risk and wishes to further contemplate this strategy.  He will contact my office if he wishes to proceed with ablation. We will continue pradaxa as he will likely proceed with ablation soon, though should he decide to defer ablation, he could stop pradaxa long term as his CHADSVASC score is 0.

## 2011-08-06 LAB — HEPATIC FUNCTION PANEL
AST: 26 U/L (ref 0–37)
Alkaline Phosphatase: 89 U/L (ref 39–117)
Bilirubin, Direct: 0.1 mg/dL (ref 0.0–0.3)
Total Bilirubin: 0.8 mg/dL (ref 0.3–1.2)

## 2011-08-06 LAB — T4, FREE: Free T4: 1.21 ng/dL (ref 0.60–1.60)

## 2011-08-07 ENCOUNTER — Telehealth: Payer: Self-pay | Admitting: Internal Medicine

## 2011-08-07 NOTE — Telephone Encounter (Signed)
Fu call °Patient returning your call °

## 2011-08-08 NOTE — Telephone Encounter (Signed)
Spoke with pt yesterday afternoon ---He is aware of normal labs but wanted to ask Dr Johney Frame about coming off Amiodarone and Pradaxa. I spoke with Dr Johney Frame and he said that at this time he doesn't think that is the best idea and would like him to see Dr Ladona Ridgel back to discuss that with him.  LMOM for pt to call back and schedule a follow up with Dr Ladona Ridgel (discuss Amio and Pradaxa therapy per Dr Johney Frame)

## 2011-09-19 ENCOUNTER — Ambulatory Visit: Payer: 59 | Admitting: Internal Medicine

## 2011-10-16 ENCOUNTER — Ambulatory Visit (INDEPENDENT_AMBULATORY_CARE_PROVIDER_SITE_OTHER): Payer: 59 | Admitting: Internal Medicine

## 2011-10-16 ENCOUNTER — Encounter: Payer: Self-pay | Admitting: Internal Medicine

## 2011-10-16 VITALS — BP 114/72 | HR 70 | Ht 70.0 in | Wt 201.8 lb

## 2011-10-16 DIAGNOSIS — I4891 Unspecified atrial fibrillation: Secondary | ICD-10-CM

## 2011-10-16 NOTE — Patient Instructions (Signed)
Your physician wants you to follow-up in: 4 months with Dr Court Joy will receive a reminder letter in the mail two months in advance. If you don't receive a letter, please call our office to schedule the follow-up appointment.  Your physician recommends that you return for lab work today: LIVER/TSH/T4

## 2011-10-16 NOTE — Assessment & Plan Note (Signed)
The patient has maintained sinus rhythm on amiodarone. Today I had an extensive discussion with the patient regarding the risk and benefits of amiodarone as well as risk and benefits of catheter ablation. He understands that in the long term, amiodarone may not be a good drug for him. For now however he would like to continue low-dose amiodarone. We'll plan to check liver function and thyroid function studies. For now he will continue amiodarone 200 mg a day.

## 2011-10-16 NOTE — Progress Notes (Signed)
HPI Mr. Langhans returns today for followup. He is a very pleasant middle-aged man with persistent atrial fibrillation. He failed flecainide therapy as well as Tikosyn therapy. On amiodarone, he is maintaining sinus rhythm after undergoing cardioversion. He's been on 400 mg a day and is now down to 200 mg daily. He feels much improved. He denies syncope or near syncope. The patient saw my partner Dr. Johney Frame for consultation regarding atrial fibrillation ablation. After consideration he would like to hold off on this treatment and continue amiodarone therapy for now. He denies chest pain or shortness of breath. No Known Allergies   Current Outpatient Prescriptions  Medication Sig Dispense Refill  . amiodarone (PACERONE) 200 MG tablet Take one tablet by mouth daily.      Marland Kitchen aspirin 81 MG tablet Take 81 mg by mouth daily.      Marland Kitchen ibuprofen (ADVIL,MOTRIN) 200 MG tablet Take 400 mg by mouth every 6 (six) hours as needed. For pain         Past Medical History  Diagnosis Date  . Atrial fibrillation     persistent  . IBS (irritable bowel syndrome)   . Mild tricuspid regurgitation     ROS:   All systems reviewed and negative except as noted in the HPI.   Past Surgical History  Procedure Date  . Cardioversion   . Cardiac catheterization 01/21/11     No family history on file.   History   Social History  . Marital Status: Married    Spouse Name: N/A    Number of Children: N/A  . Years of Education: N/A   Occupational History  .      Engineer, structural   Social History Main Topics  . Smoking status: Never Smoker   . Smokeless tobacco: Not on file  . Alcohol Use: Yes     one per week  . Drug Use: No  . Sexually Active: Not on file   Other Topics Concern  . Not on file   Social History Narrative   Works as a Contractor.     BP 114/72  Pulse 70  Ht 5\' 10"  (1.778 m)  Wt 91.536 kg (201 lb 12.8 oz)  BMI 28.96 kg/m2  Physical Exam:  Well  appearing middle-aged man, NAD HEENT: Unremarkable Neck:  No JVD, no thyromegally Lungs:  Clear with no wheezes, rales, or rhonchi. HEART:  Regular rate rhythm, no murmurs, no rubs, no clicks Abd:  soft, positive bowel sounds, no organomegally, no rebound, no guarding Ext:  2 plus pulses, no edema, no cyanosis, no clubbing Skin:  No rashes no nodules Neuro:  CN II through XII intact, motor grossly intact  EKG Normal sinus rhythm.  Assess/Plan:

## 2011-10-17 LAB — HEPATIC FUNCTION PANEL
ALT: 39 U/L (ref 0–53)
AST: 30 U/L (ref 0–37)
Total Bilirubin: 0.7 mg/dL (ref 0.3–1.2)
Total Protein: 6.8 g/dL (ref 6.0–8.3)

## 2011-10-17 LAB — TSH: TSH: 3.32 u[IU]/mL (ref 0.35–5.50)

## 2012-02-19 ENCOUNTER — Ambulatory Visit: Payer: 59 | Admitting: Internal Medicine

## 2012-04-01 ENCOUNTER — Ambulatory Visit (INDEPENDENT_AMBULATORY_CARE_PROVIDER_SITE_OTHER): Payer: 59 | Admitting: Internal Medicine

## 2012-04-01 ENCOUNTER — Encounter: Payer: Self-pay | Admitting: Internal Medicine

## 2012-04-01 VITALS — BP 122/78 | HR 67 | Resp 18 | Ht 70.0 in | Wt 207.8 lb

## 2012-04-01 DIAGNOSIS — Z7901 Long term (current) use of anticoagulants: Secondary | ICD-10-CM

## 2012-04-01 DIAGNOSIS — I4891 Unspecified atrial fibrillation: Secondary | ICD-10-CM

## 2012-04-01 NOTE — Assessment & Plan Note (Signed)
He is currently not taking systemic anticoagulation but will start in the middle November in anticipation of his catheter ablation.

## 2012-04-01 NOTE — Assessment & Plan Note (Signed)
He appears to be maintaining sinus rhythm very nicely. I've discussed the treatment options with the patient in detail. He would like to pursue catheter ablation. He has seen my partner Dr. Johney Frame in the past. I will have him follow back up in approximately 6 weeks. Tentatively, he will undergo catheter ablation in approximately 9 or 10 weeks. He will be started on anticoagulation in approximately 6 weeks. He will continue his current dose of amiodarone.

## 2012-04-01 NOTE — Progress Notes (Signed)
HPI Mr. Chad Braun returns today for followup. He is a very pleasant 46 year old man with a history of persistent atrial fibrillation who failed flecainide therapy as well as multiple cardioversions. He was loaded with amiodarone and cardioverted and returns today for followup. He was initially on 400mg  a day but approximately 6 months ago was reduced to 200 mg a day of amiodarone. He denies palpitations. He is interested in pursuing catheter ablation. No Known Allergies   Current Outpatient Prescriptions  Medication Sig Dispense Refill  . amiodarone (PACERONE) 200 MG tablet Take one tablet by mouth daily.      Marland Kitchen aspirin 81 MG tablet Take 81 mg by mouth daily.      Marland Kitchen ibuprofen (ADVIL,MOTRIN) 200 MG tablet Take 400 mg by mouth every 6 (six) hours as needed. For pain         Past Medical History  Diagnosis Date  . Atrial fibrillation     persistent  . IBS (irritable bowel syndrome)   . Mild tricuspid regurgitation     ROS:   All systems reviewed and negative except as noted in the HPI.   Past Surgical History  Procedure Date  . Cardioversion   . Cardiac catheterization 01/21/11     No family history on file.   History   Social History  . Marital Status: Married    Spouse Name: N/A    Number of Children: N/A  . Years of Education: N/A   Occupational History  .      Engineer, structural   Social History Main Topics  . Smoking status: Never Smoker   . Smokeless tobacco: Not on file  . Alcohol Use: Yes     one per week  . Drug Use: No  . Sexually Active: Not on file   Other Topics Concern  . Not on file   Social History Narrative   Works as a Contractor.     BP 122/78  Pulse 67  Resp 18  Ht 5\' 10"  (1.778 m)  Wt 207 lb 12.8 oz (94.257 kg)  BMI 29.82 kg/m2  SpO2 93%  Physical Exam:  Well appearing middle-aged man, NAD HEENT: Unremarkable Neck:  No JVD, no thyromegally Lungs:  Clear with no wheezes, rales, or rhonchi. HEART:   Regular rate rhythm, no murmurs, no rubs, no clicks Abd:  soft, positive bowel sounds, no organomegally, no rebound, no guarding Ext:  2 plus pulses, no edema, no cyanosis, no clubbing Skin:  No rashes no nodules Neuro:  CN II through XII intact, motor grossly intact  EKG Normal sinus rhythm with normal axis and intervals.  Assess/Plan:

## 2012-04-01 NOTE — Patient Instructions (Addendum)
Will see Dr Johney Frame in Nov to discuss ablation in Dec  Appointment in office on  11/25 or 11/27   Your physician has recommended you make the following change in your medication:  1) Start Pradaxa 150mg  twice daily back  Mid November   Days for the ablation are 12/3, 12/05, 12/10,12/13,12/17  Call me Tresa Endo [phone] and let me know what day is good for you to do the ablation

## 2012-05-25 ENCOUNTER — Ambulatory Visit (INDEPENDENT_AMBULATORY_CARE_PROVIDER_SITE_OTHER): Payer: 59 | Admitting: Internal Medicine

## 2012-05-25 ENCOUNTER — Encounter: Payer: Self-pay | Admitting: Internal Medicine

## 2012-05-25 VITALS — BP 121/79 | HR 81 | Ht 70.0 in | Wt 210.0 lb

## 2012-05-25 DIAGNOSIS — I4891 Unspecified atrial fibrillation: Secondary | ICD-10-CM

## 2012-05-25 DIAGNOSIS — Z7901 Long term (current) use of anticoagulants: Secondary | ICD-10-CM

## 2012-05-25 NOTE — Patient Instructions (Addendum)
Your physician has recommended that you have an ablation. Catheter ablation is a medical procedure used to treat some cardiac arrhythmias (irregular heartbeats). During catheter ablation, a long, thin, flexible tube is put into a blood vessel in your groin (upper thigh), or neck. This tube is called an ablation catheter. It is then guided to your heart through the blood vessel. Radio frequency waves destroy small areas of heart tissue where abnormal heartbeats may cause an arrhythmia to start. Please see the instruction sheet given to you today.  afib ablation on 07/08/11--- will let you know when to be at the hospital    Start Pradaxa on 06/16/12  Labs on 06/29/12 in the afternoon

## 2012-05-31 HISTORY — PX: CARDIOVERSION: SHX1299

## 2012-06-04 ENCOUNTER — Other Ambulatory Visit: Payer: Self-pay | Admitting: *Deleted

## 2012-06-04 ENCOUNTER — Encounter: Payer: Self-pay | Admitting: *Deleted

## 2012-06-04 DIAGNOSIS — I4891 Unspecified atrial fibrillation: Secondary | ICD-10-CM

## 2012-06-08 ENCOUNTER — Encounter: Payer: Self-pay | Admitting: Internal Medicine

## 2012-06-08 NOTE — Assessment & Plan Note (Signed)
Stop ASA Start pradaxa 150mg  BID

## 2012-06-08 NOTE — Progress Notes (Signed)
   Referring Physician:  Dr Taylor   Chad Braun is a 46 y.o. male with a h/o persistent atrial fibrillation who presents today to consider catheter ablation for his afib.  He has failed flecainide therapy and has required multiple cardioversions. He was loaded with amiodarone and cardioverted recently.  He has been able to maintain sinus rhythm with amiodarone.  He reports significant improvement in exercise tolerance anf fatigue.  He denies palpitations. He is interested in pursuing catheter ablation.   Today, he denies symptoms of palpitations, chest pain, shortness of breath, orthopnea, PND, lower extremity edema, dizziness, presyncope, syncope, or neurologic sequela. The patient is tolerating medications without difficulties and is otherwise without complaint today.   Past Medical History  Diagnosis Date  . Persistent atrial fibrillation   . IBS (irritable bowel syndrome)   . Mild tricuspid regurgitation    Past Surgical History  Procedure Date  . Cardioversion   . Cardiac catheterization 01/21/11    Current Outpatient Prescriptions  Medication Sig Dispense Refill  . amiodarone (PACERONE) 200 MG tablet Take one tablet by mouth daily.      . aspirin 81 MG tablet Take 81 mg by mouth as needed.       . ibuprofen (ADVIL,MOTRIN) 200 MG tablet Take 400 mg by mouth every 6 (six) hours as needed. For pain        No Known Allergies  History   Social History  . Marital Status: Married    Spouse Name: N/A    Number of Children: N/A  . Years of Education: N/A   Occupational History  .      Commercial garage dorr installation   Social History Main Topics  . Smoking status: Never Smoker   . Smokeless tobacco: Not on file  . Alcohol Use: Yes     Comment: one per week  . Drug Use: No  . Sexually Active: Not on file   Other Topics Concern  . Not on file   Social History Narrative   Works as a garage door installer.    No family history on file.  ROS- All systems are  reviewed and negative except as per the HPI above  Physical Exam: Filed Vitals:   05/25/12 1623  BP: 121/79  Pulse: 81  Height: 5' 10" (1.778 m)  Weight: 210 lb (95.255 kg)    GEN- The patient is well appearing, alert and oriented x 3 today.   Head- normocephalic, atraumatic Eyes-  Sclera clear, conjunctiva pink Ears- hearing intact Oropharynx- clear Neck- supple, no JVP Lymph- no cervical lymphadenopathy Lungs- Clear to ausculation bilaterally, normal work of breathing Heart- Regular rate and rhythm, no murmurs, rubs or gallops, PMI not laterally displaced GI- soft, NT, ND, + BS Extremities- no clubbing, cyanosis, or edema MS- no significant deformity or atrophy Skin- no rash or lesion Psych- euthymic mood, full affect Neuro- strength and sensation are intact  EKG today reveals sinus rhythm 80 bpm, PR 148, QRS 82, otherwise normal ekg Echo 01/23/11- EF 45-50%, no significant valvular disease, LA size is normal  Assessment and Plan:  

## 2012-06-08 NOTE — Assessment & Plan Note (Addendum)
The patient has very symptomatic persistent atrial fibrillation.  He has previously failed medical therapy with flecainide.  He has required amiodarone to maintain sinus rhythm.  Given his young age, I share the patient and Dr Bruna Potter concern that there is risks of being on this medicine long term. Therapeutic strategies for afib including medicine and ablation were discussed in detail with the patient today. Risk, benefits, and alternatives to EP study and radiofrequency ablation for afib were also discussed in detail today. These risks include but are not limited to stroke, bleeding, vascular damage, tamponade, perforation, damage to the esophagus, lungs, and other structures, pulmonary vein stenosis, worsening renal function, and death. The patient understands these risk and wishes to proceed. I will initiate pradaxa 150mg  BID today.   We will therefore proceed with catheter ablation once the patient has been adequately anticoagulated.

## 2012-06-10 ENCOUNTER — Other Ambulatory Visit: Payer: Self-pay | Admitting: *Deleted

## 2012-06-10 DIAGNOSIS — I4891 Unspecified atrial fibrillation: Secondary | ICD-10-CM

## 2012-06-10 MED ORDER — AMIODARONE HCL 200 MG PO TABS: ORAL_TABLET | ORAL | Status: DC

## 2012-06-22 ENCOUNTER — Encounter (HOSPITAL_COMMUNITY): Payer: Self-pay | Admitting: Pharmacy Technician

## 2012-06-30 ENCOUNTER — Other Ambulatory Visit (INDEPENDENT_AMBULATORY_CARE_PROVIDER_SITE_OTHER): Payer: 59

## 2012-06-30 DIAGNOSIS — I4891 Unspecified atrial fibrillation: Secondary | ICD-10-CM

## 2012-06-30 LAB — CBC WITH DIFFERENTIAL/PLATELET
Basophils Relative: 0.8 % (ref 0.0–3.0)
Eosinophils Absolute: 0.1 10*3/uL (ref 0.0–0.7)
Eosinophils Relative: 1.8 % (ref 0.0–5.0)
HCT: 44.4 % (ref 39.0–52.0)
Lymphs Abs: 2.2 10*3/uL (ref 0.7–4.0)
MCHC: 34.5 g/dL (ref 30.0–36.0)
MCV: 91.9 fl (ref 78.0–100.0)
Monocytes Absolute: 0.5 10*3/uL (ref 0.1–1.0)
RBC: 4.84 Mil/uL (ref 4.22–5.81)
WBC: 5.1 10*3/uL (ref 4.5–10.5)

## 2012-06-30 LAB — BASIC METABOLIC PANEL
BUN: 11 mg/dL (ref 6–23)
CO2: 29 mEq/L (ref 19–32)
Chloride: 102 mEq/L (ref 96–112)
Potassium: 3.7 mEq/L (ref 3.5–5.1)

## 2012-07-07 ENCOUNTER — Ambulatory Visit (HOSPITAL_COMMUNITY): Payer: 59 | Admitting: Certified Registered"

## 2012-07-07 ENCOUNTER — Ambulatory Visit (HOSPITAL_COMMUNITY)
Admission: RE | Admit: 2012-07-07 | Discharge: 2012-07-08 | Disposition: A | Discharge status: Home / Self Care | Payer: 59 | Source: Ambulatory Visit | Attending: Internal Medicine | Admitting: Internal Medicine

## 2012-07-07 ENCOUNTER — Encounter (HOSPITAL_COMMUNITY)
Admission: RE | Disposition: A | Discharge status: Home / Self Care | Payer: Self-pay | Source: Ambulatory Visit | Attending: Internal Medicine

## 2012-07-07 ENCOUNTER — Encounter (HOSPITAL_COMMUNITY): Payer: Self-pay | Admitting: Certified Registered"

## 2012-07-07 ENCOUNTER — Encounter (HOSPITAL_COMMUNITY): Payer: Self-pay | Admitting: Cardiology

## 2012-07-07 DIAGNOSIS — I519 Heart disease, unspecified: Secondary | ICD-10-CM | POA: Diagnosis present

## 2012-07-07 DIAGNOSIS — Z7901 Long term (current) use of anticoagulants: Secondary | ICD-10-CM

## 2012-07-07 DIAGNOSIS — I4891 Unspecified atrial fibrillation: Secondary | ICD-10-CM | POA: Insufficient documentation

## 2012-07-07 DIAGNOSIS — I079 Rheumatic tricuspid valve disease, unspecified: Secondary | ICD-10-CM | POA: Insufficient documentation

## 2012-07-07 HISTORY — PX: TEE WITHOUT CARDIOVERSION: SHX5443

## 2012-07-07 HISTORY — PX: ATRIAL FIBRILLATION ABLATION: SHX5456

## 2012-07-07 LAB — POCT ACTIVATED CLOTTING TIME
Activated Clotting Time: 263 seconds
Activated Clotting Time: 307 seconds
Activated Clotting Time: 154 seconds

## 2012-07-07 LAB — MRSA PCR SCREENING: MRSA by PCR: NEGATIVE

## 2012-07-07 SURGERY — ECHOCARDIOGRAM, TRANSESOPHAGEAL
Anesthesia: Moderate Sedation

## 2012-07-07 SURGERY — ATRIAL FIBRILLATION ABLATION
Anesthesia: General

## 2012-07-07 MED ORDER — SODIUM CHLORIDE 0.9 % IV SOLN
INTRAVENOUS | Status: DC
  Administered 2012-07-07: 09:00:00 via INTRAVENOUS

## 2012-07-07 MED ORDER — MIDAZOLAM HCL 5 MG/ML IJ SOLN
INTRAMUSCULAR | Status: AC
  Filled 2012-07-07: qty 1

## 2012-07-07 MED ORDER — ONDANSETRON HCL 4 MG/2ML IJ SOLN: 4.0000 mg | Freq: Four times a day (QID) | INTRAMUSCULAR | Status: DC | PRN

## 2012-07-07 MED ORDER — ONDANSETRON HCL 4 MG/2ML IJ SOLN
INTRAMUSCULAR | Status: DC | PRN
  Administered 2012-07-07: 4 mg via INTRAVENOUS

## 2012-07-07 MED ORDER — PROPOFOL 10 MG/ML IV BOLUS
INTRAVENOUS | Status: DC | PRN
  Administered 2012-07-07: 180 mg via INTRAVENOUS

## 2012-07-07 MED ORDER — SODIUM CHLORIDE 0.9 % IJ SOLN: 3.0000 mL | INTRAMUSCULAR | Status: DC | PRN

## 2012-07-07 MED ORDER — HEPARIN SODIUM (PORCINE) 1000 UNIT/ML IJ SOLN
INTRAMUSCULAR | Status: DC | PRN
  Administered 2012-07-07: 1000 [IU] via INTRAVENOUS
  Administered 2012-07-07: 10000 [IU] via INTRAVENOUS

## 2012-07-07 MED ORDER — HYDROCODONE-ACETAMINOPHEN 5-325 MG PO TABS
1.0000 | ORAL_TABLET | ORAL | Status: DC | PRN
  Administered 2012-07-07: 1 via ORAL
  Filled 2012-07-07: qty 1

## 2012-07-07 MED ORDER — FENTANYL CITRATE 0.05 MG/ML IJ SOLN
INTRAMUSCULAR | Status: DC | PRN
  Administered 2012-07-07: 100 ug via INTRAVENOUS
  Administered 2012-07-07 (×2): 25 ug via INTRAVENOUS

## 2012-07-07 MED ORDER — MIDAZOLAM HCL 5 MG/5ML IJ SOLN
INTRAMUSCULAR | Status: DC | PRN
  Administered 2012-07-07: 2 mg via INTRAVENOUS

## 2012-07-07 MED ORDER — ACETAMINOPHEN 10 MG/ML IV SOLN
1000.0000 mg | Freq: Once | INTRAVENOUS | Status: AC | PRN
  Filled 2012-07-07: qty 100

## 2012-07-07 MED ORDER — ARTIFICIAL TEARS OP OINT
TOPICAL_OINTMENT | OPHTHALMIC | Status: DC | PRN
  Administered 2012-07-07: 1 via OPHTHALMIC

## 2012-07-07 MED ORDER — ACETAMINOPHEN 325 MG PO TABS: 650.0000 mg | ORAL_TABLET | ORAL | Status: DC | PRN

## 2012-07-07 MED ORDER — SODIUM CHLORIDE 0.9 % IJ SOLN
3.0000 mL | Freq: Two times a day (BID) | INTRAMUSCULAR | Status: DC
  Administered 2012-07-07: 3 mL via INTRAVENOUS

## 2012-07-07 MED ORDER — ONDANSETRON HCL 4 MG/2ML IJ SOLN: 4.0000 mg | Freq: Once | INTRAMUSCULAR | Status: AC | PRN

## 2012-07-07 MED ORDER — LIDOCAINE VISCOUS 2 % MT SOLN
OROMUCOSAL | Status: AC
  Filled 2012-07-07: qty 15

## 2012-07-07 MED ORDER — LIDOCAINE VISCOUS 2 % MT SOLN
OROMUCOSAL | Status: DC | PRN
  Administered 2012-07-07: 15 mL via OROMUCOSAL

## 2012-07-07 MED ORDER — MIDAZOLAM HCL 10 MG/2ML IJ SOLN
INTRAMUSCULAR | Status: DC | PRN
  Administered 2012-07-07: 2 mg via INTRAVENOUS
  Administered 2012-07-07: 3 mg via INTRAVENOUS
  Administered 2012-07-07: 2 mg via INTRAVENOUS
  Administered 2012-07-07: 3 mg via INTRAVENOUS

## 2012-07-07 MED ORDER — FENTANYL CITRATE 0.05 MG/ML IJ SOLN
INTRAMUSCULAR | Status: AC
  Filled 2012-07-07: qty 2

## 2012-07-07 MED ORDER — SODIUM CHLORIDE 0.9 % IV SOLN: 250.0000 mL | INTRAVENOUS | Status: DC | PRN

## 2012-07-07 MED ORDER — MIDAZOLAM HCL 5 MG/ML IJ SOLN
INTRAMUSCULAR | Status: AC
  Filled 2012-07-07: qty 2

## 2012-07-07 MED ORDER — HYDROMORPHONE HCL PF 1 MG/ML IJ SOLN: 0.2500 mg | INTRAMUSCULAR | Status: DC | PRN

## 2012-07-07 MED ORDER — PROTAMINE SULFATE 10 MG/ML IV SOLN
INTRAVENOUS | Status: DC | PRN
  Administered 2012-07-07 (×3): 10 mg via INTRAVENOUS

## 2012-07-07 MED ORDER — ISOPROTERENOL HCL 0.2 MG/ML IJ SOLN
1000.0000 ug | INTRAVENOUS | Status: DC | PRN
  Administered 2012-07-07: 20 ug/min via INTRAVENOUS

## 2012-07-07 MED ORDER — FENTANYL CITRATE 0.05 MG/ML IJ SOLN
INTRAMUSCULAR | Status: DC | PRN
  Administered 2012-07-07: 25 ug via INTRAVENOUS
  Administered 2012-07-07: 50 ug via INTRAVENOUS

## 2012-07-07 MED ORDER — DEXTROSE 5 % IV SOLN
INTRAVENOUS | Status: AC
  Filled 2012-07-07: qty 250

## 2012-07-07 MED ORDER — BUPIVACAINE HCL (PF) 0.25 % IJ SOLN
INTRAMUSCULAR | Status: AC
  Filled 2012-07-07: qty 30

## 2012-07-07 MED ORDER — LACTATED RINGERS IV SOLN
INTRAVENOUS | Status: DC | PRN
  Administered 2012-07-07: 12:00:00 via INTRAVENOUS

## 2012-07-07 MED ORDER — HYDROXYUREA 500 MG PO CAPS
ORAL_CAPSULE | ORAL | Status: AC
  Filled 2012-07-07: qty 1

## 2012-07-07 MED ORDER — DABIGATRAN ETEXILATE MESYLATE 150 MG PO CAPS
150.0000 mg | ORAL_CAPSULE | Freq: Two times a day (BID) | ORAL | Status: DC
  Administered 2012-07-07 – 2012-07-08 (×2): 150 mg via ORAL
  Filled 2012-07-07 (×3): qty 1

## 2012-07-07 NOTE — Anesthesia Procedure Notes (Signed)
Procedure Name: LMA Insertion Date/Time: 07/07/2012 11:51 AM Performed by: Jefm Miles E Pre-anesthesia Checklist: Patient identified, Patient being monitored, Emergency Drugs available, Timeout performed and Suction available Patient Re-evaluated:Patient Re-evaluated prior to inductionOxygen Delivery Method: Circle system utilized Preoxygenation: Pre-oxygenation with 100% oxygen Intubation Type: IV induction LMA: LMA inserted LMA Size: 4.0 Number of attempts: 1 Placement Confirmation: positive ETCO2 and breath sounds checked- equal and bilateral Tube secured with: Tape Dental Injury: Teeth and Oropharynx as per pre-operative assessment

## 2012-07-07 NOTE — Anesthesia Preprocedure Evaluation (Signed)
Anesthesia Evaluation  Patient identified by MRN, date of birth, ID band Patient awake    Reviewed: Allergy & Precautions, H&P , NPO status , Patient's Chart, lab work & pertinent test results  Airway Mallampati: II TM Distance: >3 FB     Dental  (+) Teeth Intact and Dental Advisory Given   Pulmonary  breath sounds clear to auscultation        Cardiovascular Rhythm:Regular Rate:Normal     Neuro/Psych    GI/Hepatic   Endo/Other    Renal/GU      Musculoskeletal   Abdominal   Peds  Hematology   Anesthesia Other Findings   Reproductive/Obstetrics                           Anesthesia Physical Anesthesia Plan  ASA: III  Anesthesia Plan: General   Post-op Pain Management:    Induction: Intravenous  Airway Management Planned: LMA  Additional Equipment:   Intra-op Plan:   Post-operative Plan: Extubation in OR  Informed Consent: I have reviewed the patients History and Physical, chart, labs and discussed the procedure including the risks, benefits and alternatives for the proposed anesthesia with the patient or authorized representative who has indicated his/her understanding and acceptance.   Dental advisory given  Plan Discussed with: CRNA and Surgeon  Anesthesia Plan Comments: (Persistent Afib Normal coronaries mildly decreased LV function  Plan GA with LMA)        Anesthesia Quick Evaluation

## 2012-07-07 NOTE — H&P (View-Only) (Signed)
   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 male with a h/o persistent atrial fibrillation who presents today to consider catheter ablation for his afib.  He has failed flecainide therapy and has required multiple cardioversions. He was loaded with amiodarone and cardioverted recently.  He has been able to maintain sinus rhythm with amiodarone.  He reports significant improvement in exercise tolerance anf fatigue.  He denies palpitations. He is interested in pursuing catheter ablation.   Today, he denies symptoms of palpitations, chest pain, shortness of breath, orthopnea, PND, lower extremity edema, dizziness, presyncope, syncope, or neurologic sequela. The patient is tolerating medications without difficulties and is otherwise without complaint today.   Past Medical History  Diagnosis Date  . Persistent atrial fibrillation   . IBS (irritable bowel syndrome)   . Mild tricuspid regurgitation    Past Surgical History  Procedure Date  . Cardioversion   . Cardiac catheterization 01/21/11    Current Outpatient Prescriptions  Medication Sig Dispense Refill  . amiodarone (PACERONE) 200 MG tablet Take one tablet by mouth daily.      Marland Kitchen aspirin 81 MG tablet Take 81 mg by mouth as needed.       Marland Kitchen ibuprofen (ADVIL,MOTRIN) 200 MG tablet Take 400 mg by mouth every 6 (six) hours as needed. For pain        No Known Allergies  History   Social History  . Marital Status: Married    Spouse Name: N/A    Number of Children: N/A  . Years of Education: N/A   Occupational History  .      Engineer, structural   Social History Main Topics  . Smoking status: Never Smoker   . Smokeless tobacco: Not on file  . Alcohol Use: Yes     Comment: one per week  . Drug Use: No  . Sexually Active: Not on file   Other Topics Concern  . Not on file   Social History Narrative   Works as a Contractor.    No family history on file.  ROS- All systems are  reviewed and negative except as per the HPI above  Physical Exam: Filed Vitals:   05/25/12 1623  BP: 121/79  Pulse: 81  Height: 5\' 10"  (1.778 m)  Weight: 210 lb (95.255 kg)    GEN- The patient is well appearing, alert and oriented x 3 today.   Head- normocephalic, atraumatic Eyes-  Sclera clear, conjunctiva pink Ears- hearing intact Oropharynx- clear Neck- supple, no JVP Lymph- no cervical lymphadenopathy Lungs- Clear to ausculation bilaterally, normal work of breathing Heart- Regular rate and rhythm, no murmurs, rubs or gallops, PMI not laterally displaced GI- soft, NT, ND, + BS Extremities- no clubbing, cyanosis, or edema MS- no significant deformity or atrophy Skin- no rash or lesion Psych- euthymic mood, full affect Neuro- strength and sensation are intact  EKG today reveals sinus rhythm 80 bpm, PR 148, QRS 82, otherwise normal ekg Echo 01/23/11- EF 45-50%, no significant valvular disease, LA size is normal  Assessment and Plan:

## 2012-07-07 NOTE — Anesthesia Postprocedure Evaluation (Signed)
  Anesthesia Post-op Note  Patient: Chad Braun  Procedure(s) Performed: Procedure(s) (LRB) with comments: ATRIAL FIBRILLATION ABLATION (N/A)  Patient Location: Cath Lab  Anesthesia Type:General  Level of Consciousness: awake, alert  and oriented  Airway and Oxygen Therapy: Patient Spontanous Breathing  Post-op Pain: none  Post-op Assessment: Post-op Vital signs reviewed, Patient's Cardiovascular Status Stable, Respiratory Function Stable, Patent Airway, No signs of Nausea or vomiting and Pain level controlled  Post-op Vital Signs: stable  Complications: No apparent anesthesia complications

## 2012-07-07 NOTE — Preoperative (Signed)
Beta Blockers   Reason not to administer Beta Blockers:Not Applicable 

## 2012-07-07 NOTE — Op Note (Signed)
SURGEON:  Hillis Range, MD  PREPROCEDURE DIAGNOSES: 1. Persistent atrial fibrillation.  POSTPROCEDURE DIAGNOSES: 1. Persistent atrial fibrillation.  PROCEDURES: 1. Comprehensive electrophysiologic study. 2. Coronary sinus pacing and recording. 3. Three-dimensional mapping of atrial fibrillation with additional mapping and ablation of a second discrete focus 4. Ablation of atrial fibrillation with additional mapping and ablation of a second discrete focus 5. Intracardiac echocardiography. 6. Transseptal puncture of an intact septum. 7. Rotational Angiography with processing at an independent workstation 8. Arrhythmia induction with pacing with isuprel infusion  INTRODUCTION:  Chad Braun is a 47 y.o. male with a history of atrial fibrillation who now presents for EP study and radiofrequency ablation.  The patient reports initially being diagnosed with atrial fibrillation after presenting with symptomatic palpitations and fatgiue. The patient reports increasing frequency and duration of atrial fibrillation since that time.  The patient has failed medical therapy with flecainide and amiodarone.  The patient therefore presents today for catheter ablation of atrial fibrillation.  DESCRIPTION OF PROCEDURE:  Informed written consent was obtained, and the patient was brought to the electrophysiology lab in a fasting state.  The patient was adequately sedated with intravenous medications as outlined in the anesthesia report.  The patient's left and right groins were prepped and draped in the usual sterile fashion by the EP lab staff.  Using a percutaneous Seldinger technique, two 7-French and one 11-French hemostasis sheath was placed into the right common femoral vein.   3 Dimensional Rotational Angiography: A 5 french pigtail catheter was introduced through the right common femoral vein and advanced into the inferior venocava.  3 demential rotational angiography was then performed by power  injection of 100cc of nonionic contrast.  Reprocessing at an independent work station was then performed.   This demonstrated a moderate sized left atrium with 4 separate pulmonary veins which were also moderate in size.  A right middle pulmonary vein was also present and was small in size.  A 3 dimensional rendering of the left atrium was then merged using [employer] onto the [employer] system and registered with intracardiac echo (see below).  The pigtail catheter was then removed.  Catheter Placement:  A 7-French Biosense Webster Decapolar coronary sinus catheter was introduced through the right common femoral vein and advanced into the coronary sinus for recording and pacing from this location.  A 6-French quadripolar Josephson catheter was introduced through the right common femoral vein and advanced into the right ventricle for recording and pacing.  This catheter was then pulled back to the His bundle location.    Initial Measurements: The patient presented to the electrophysiology lab in sinus rhythm.  His PR interval measured with a QRS duringation of and a QT interval of msec.  The AH interval measured 97 and the HV interval measured 45 msec.   Ventricular pacing was performed which revealed midline concentric VA conduction with a VA wenkebach cycle length of .  Ventricular extrastimulous testing was performed which again revealed midline decremental VA conduction with a retrograde AV nodal ERP of 600/552msec.  No arrhythmias were observed.   Intracardiac Echocardiography: A 10-French Biosense Webster AcuNav intracardiac echocardiography catheter was introduced through the left common femoral vein and advanced into the right atrium. Intracardiac echocardiography was performed of the left atrium, and a three-dimensional anatomical rendering of the left atrium was performed using CARTO sound technology.  The patient was noted to have a moderate sized  left atrium.  The interatrial septum was prominent and  aneurysmal. All 4 pulmonary veins were visualized and noted to have separate ostia.  The pulmonary veins were moderate in size.  The fifth middle pulmonary vein was also present and small with its own separate ostium.  The left atrial appendage was visualized and did not reveal thrombus.   There was no evidence of pulmonary vein stenosis.   Transseptal Puncture: The middle right common femoral vein sheath was exchanged for an 8.5 [place] SL2 transseptal sheath and transseptal access was achieved in a standard fashion using a Brockenbrough needle under biplane fluoroscopy with intracardiac echocardiography confirmation of the transseptal puncture.  Once transseptal access had been achieved, heparin was administered intravenously and intra- arterially in order to maintain an ACT of greater than 350 seconds throughout the procedure.   3D Mapping and Ablation: The His bundle catheter was removed and in its place a 3.5 mm Biosense Webster EZ [employer] ablation catheter was advanced into the right atrium.  The transseptal sheath was pulled back into the IVC over a guidewire.  The ablation catheter was advanced across the transseptal hole using the wire as a guide.  The transseptal sheath was then re-advanced over the guidewire into the left atrium.  A duodecapolar Biosense Webster circular mapping catheter was introduced through the transseptal sheath and positioned over the mouth of all 4 pulmonary veins.  Three-dimensional electroanatomical mapping was performed using CARTO technology.  This demonstrated electrical activity within all four pulmonary veins at baseline. The patient underwent successful sequential electrical isolation and anatomical encircling of all four pulmonary veins using radiofrequency current with a circular mapping catheter as a guide.  The small right middle pulmonary vein was also anatomically encircled with RF.  Given its small  size, electrical isolation could not be verified.  The circular mapping catheter was pulled back into the right atrium and 3D mapping was performed at the junction of the superior vena cava and right atrium.  Electrical activity was observed within the SVC.  I therefore elected to perform right atrial ablation in this area.  A series of radiofrequency applications were delivered in a circular fashion around the ostium of the SVC.  Prior to each ablation lesions, pacing was performed from the distal ablation electrode to insure that diaphragmatic stimulation was not observed to avoid phrenic nerve injury.  Diaphragmatic excursion was also observed during ablation.    Measurements Following Ablation: Following ablation, Isuprel was infused up to 20 mcg/min with no inducible atrial fibrillation, atrial tachycardia, atrial flutter, or sustained PACs. In sinus rhythm with RR interval was 990, with PR , QRS 95 msec, and Qtc 438 msec.  Following ablation the AH interval measured with an HV interval of 50 msec.  Rapid atrial pacing was performed, which revealed an AV Wenckebach cycle length of 370 msec.  No arrhythmias were observed with rapid atrial pacing down to a cycle length of .  Electroisolation was then again confirmed in all four pulmonary veins.  Intracardiac echocardiography was again performed, which revealed no pericardial effusion.  The procedure was therefore considered completed.  All catheters were removed, and the sheaths were aspirated and flushed.  The patient was transferred to the recovery area for sheath removal per protocol.  A limited bedside transthoracic echocardiogram revealed no pericardial effusion.  There were no early apparent complications.  CONCLUSIONS: 1. Sinus rhythm upon presentation.   2. Rotational Angiography reveals a moderate sized left atrium with five separate pulmonary veins without evidence of pulmonary vein stenosis. 3. Successful electrical  isolation and anatomical encircling of the pulmonary veins with radiofrequency current. 4. Additional ablation was performed at the junction of the SVC/right atrium 5. No inducible arrhythmias following ablation both on and off of Isuprel 6. No early apparent complications.   Jahnay Lantier,MD 2:24 PM 07/07/2012

## 2012-07-07 NOTE — H&P (Signed)
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 He has failed flecainide therapy and has required multiple cardioversions. He was loaded with amiodarone and cardioverted recently. He has been able to maintain sinus rhythm with amiodarone. He reports significant improvement in exercise tolerance anf fatigue. He denies palpitations. He is interested in pursuing catheter ablation.  Today, he denies symptoms of palpitations, chest pain, shortness of breath, orthopnea, PND, lower extremity edema, dizziness, presyncope, syncope, or neurologic sequela. The patient is tolerating medications without difficulties and is otherwise without complaint today.   Past Medical History   Diagnosis  Date   .  Persistent atrial fibrillation    .  IBS (irritable bowel syndrome)    .  Mild tricuspid regurgitation     Past Surgical History   Procedure  Date   .  Cardioversion    .  Cardiac catheterization  01/21/11    Current Outpatient Prescriptions   Medication  Sig  Dispense  Refill   .  amiodarone (PACERONE) 200 MG tablet  Take one tablet by mouth daily.     Marland Kitchen  aspirin 81 MG tablet  Take 81 mg by mouth as needed.     Marland Kitchen  ibuprofen (ADVIL,MOTRIN) 200 MG tablet  Take 400 mg by mouth every 6 (six) hours as needed. For pain      No Known Allergies  History    Social History   .  Marital Status:  Married     Spouse Name:  N/A     Number of Children:  N/A   .  Years of Education:  N/A    Occupational History   .       Engineer, structural    Social History Main Topics   .  Smoking status:  Never Smoker   .  Smokeless tobacco:  Not on file   .  Alcohol Use:  Yes      Comment: one per week   .  Drug Use:  No   .  Sexually Active:  Not on file    Other Topics  Concern   .  Not on file    Social History Narrative    Works as a Contractor.    No family  history on file.  ROS- All systems are reviewed and negative except as per the HPI above   Physical Exam:  Filed Vitals:    05/25/12 1623   BP:  121/79   Pulse:  81   Height:  5\' 10"  (1.778 m)   Weight:  210 lb (95.255 kg)    GEN- The patient is well appearing, alert and oriented x 3 today.  Head- normocephalic, atraumatic  Eyes- Sclera clear, conjunctiva pink  Ears- hearing intact  Oropharynx- clear  Neck- supple, no JVP  Lymph- no cervical lymphadenopathy  Lungs- Clear to ausculation bilaterally, normal work of breathing  Heart- Regular rate and rhythm, no murmurs, rubs or gallops, PMI not laterally displaced  GI- soft, NT, ND, + BS  Extremities- no clubbing, cyanosis, or edema  MS- no significant deformity or atrophy  Skin- no rash or lesion  Psych- euthymic mood, full affect  Neuro- strength and sensation are intact   Echo 01/23/11- EF 45-50%, no significant valvular disease, LA size is normal  TEE reviewed today  Assessment and Plan:   Atrial fibrillation - Hillis Range,  MD   The patient has very symptomatic persistent atrial fibrillation. He has previously failed medical therapy with flecainide. He has required amiodarone to maintain sinus rhythm. Given his young age, I share the patient and Dr Bruna Potter concern that there is risks of being on this medicine long term.  Therapeutic strategies for afib including medicine and ablation were discussed in detail with the patient today. Risk, benefits, and alternatives to EP study and radiofrequency ablation for afib were also discussed in detail today. These risks include but are not limited to stroke, bleeding, vascular damage, tamponade, perforation, damage to the esophagus, lungs, and other structures, pulmonary vein stenosis, worsening renal function, and death. The patient understands these risk and wishes to proceed.  He reports compliance with twice daily pradaxa.  We will therefore proceed with catheter ablation at this time.

## 2012-07-07 NOTE — Interval H&P Note (Signed)
History and Physical Interval Note:  07/07/2012 9:07 AM  Chad Braun  has presented today for surgery, with the diagnosis of a-fib  The various methods of treatment have been discussed with the patient and family. After consideration of risks, benefits and other options for treatment, the patient has consented to  Procedure(s) (LRB) with comments: TRANSESOPHAGEAL ECHOCARDIOGRAM (TEE) (N/A) as a surgical intervention .  The patient's history has been reviewed, patient examined, no change in status, stable for surgery.  I have reviewed the patient's chart and labs.  Questions were answered to the patient's satisfaction.     Daniel Bensimhon

## 2012-07-07 NOTE — Transfer of Care (Signed)
Immediate Anesthesia Transfer of Care Note  Patient: Chad Braun  Procedure(s) Performed: Procedure(s) (LRB) with comments: ATRIAL FIBRILLATION ABLATION (N/A)  Patient Location: PACU  Anesthesia Type:General  Level of Consciousness: awake, alert  and oriented  Airway & Oxygen Therapy: Patient Spontanous Breathing and Patient connected to nasal cannula oxygen  Post-op Assessment: Report given to PACU RN  Post vital signs: Reviewed and stable  Complications: No apparent anesthesia complications

## 2012-07-07 NOTE — CV Procedure (Signed)
    TRANSESOPHAGEAL ECHOCARDIOGRAM   NAME:  Chad Braun   MRN: [id number] DOB:  [birth year]   ADMIT DATE: 07/07/2012  INDICATIONS: Atrial fibrillation   PROCEDURE:   Informed consent was obtained prior to the procedure. The risks, benefits and alternatives for the procedure were discussed and the patient comprehended these risks.  Risks include, but are not limited to, cough, sore throat, vomiting, nausea, somnolence, esophageal and stomach trauma or perforation, bleeding, low blood pressure, aspiration, pneumonia, infection, trauma to the teeth and death.    After a procedural time-out, the patient was given 10 mg versed and 75 mcg fentanyl for moderate sedation.  The oropharynx was anesthetized 10 cc of topical 1% viscous lidocaine.  The transesophageal probe was inserted in the esophagus and stomach without difficulty and multiple views were obtained.   COMPLICATIONS:    There were no immediate complications.  FINDINGS:  LEFT VENTRICLE: EF = 50-55% No regional wall motion abnormalities  RIGHT VENTRICLE: Normal  LEFT ATRIUM: Mildly dilated 4.4 cm  LEFT ATRIAL APPENDAGE: No clot. Emptying velocity 60 m/s  RIGHT ATRIUM: Normal  AORTIC VALVE:  Trileaflet. Normal. No AI/AS  MITRAL VALVE:    Normal. Trivial MR  TRICUSPID VALVE: Normal. Trivial TR  PULMONIC VALVE: Normal No PR  INTERATRIAL SEPTUM: + atrial septal aneurysm. No shunt by color Doppler or bubble study.  PERICARDIUM: No effusion  DESCENDING AORTA: Trivial plaque.

## 2012-07-07 NOTE — Progress Notes (Signed)
  Echocardiogram Echocardiogram Transesophageal has been performed.  Chad Braun 07/07/2012, 3:08 PM

## 2012-07-08 ENCOUNTER — Encounter (HOSPITAL_COMMUNITY): Payer: Self-pay | Admitting: Internal Medicine

## 2012-07-08 DIAGNOSIS — I4891 Unspecified atrial fibrillation: Secondary | ICD-10-CM

## 2012-07-08 MED ORDER — AMIODARONE HCL 200 MG PO TABS: 200.0000 mg | ORAL_TABLET | Freq: Every day | ORAL | Status: DC

## 2012-07-08 MED ORDER — PANTOPRAZOLE SODIUM 40 MG PO TBEC: 40.0000 mg | DELAYED_RELEASE_TABLET | Freq: Every day | ORAL | Status: DC

## 2012-07-08 NOTE — Progress Notes (Signed)
Patient ID: Chad Braun, male   DOB: 1966-01-04, 47 y.o.   MRN: [id number] Subjective:  No chest pain or sob Objective:  Vital Signs in the last 24 hours: Temp:  [97.6 F (36.4 C)-98.7 F (37.1 C)] 98 F (36.7 C) (01/08 0527) Pulse Rate:  [62-75] 63  (01/08 0527) Resp:  [7-23] 20  (01/08 0527) BP: (94-133)/(48-98) 114/62 mmHg (01/08 0527) SpO2:  [96 %-100 %] 96 % (01/08 0527) Weight:  [200 lb (90.719 kg)] 200 lb (90.719 kg) (01/07 0804)  Intake/Output from previous day: 01/07 0701 - 01/08 0700 In: 1060 [P.O.:360; I.V.:700] Out: 600 [Urine:600] Intake/Output from this shift:    Physical Exam: Well appearing middle aged man, NAD HEENT: Unremarkable Neck:  No JVD, no thyromegally Lungs:  Clear with no wheezes HEART:  Regular rate rhythm, no murmurs, no rubs, no clicks Abd:  Flat, positive bowel sounds, no organomegally, no rebound, no guarding Ext:  2 plus pulses, no edema, no cyanosis, no clubbing Skin:  No rashes no nodules Neuro:  CN II through XII intact, motor grossly intact  Lab Results: No results found for this basename: WBC:2,HGB:2,PLT:2 in the last 72 hours No results found for this basename: NA:2,K:2,CL:2,CO2:2,GLUCOSE:2,BUN:2,CREATININE:2 in the last 72 hours No results found for this basename: TROPONINI:2,CK,MB:2 in the last 72 hours Hepatic Function Panel No results found for this basename: PROT,ALBUMIN,AST,ALT,ALKPHOS,BILITOT,BILIDIR,IBILI in the last 72 hours No results found for this basename: CHOL in the last 72 hours No results found for this basename: PROTIME in the last 72 hours  Imaging: No results found.  Cardiac Studies: Tele - nsr Assessment/Plan:  1. Atrial fibrillation 2. S/p Catheter ablation Rec: ok for discharge. He will continue the amio that he has at home for another month then stop. Followup with Dr. Johney Frame in 8-12 weeks. No heavy lifting for a week. H+ blocker for 6 weeks.  LOS: 1 day    Yuliet Needs,M.D. 07/08/2012, 7:39  AM

## 2012-07-08 NOTE — Discharge Summary (Signed)
ELECTROPHYSIOLOGY DISCHARGE SUMMARY    Patient ID: Chad Braun,  MRN: [id number], DOB/AGE: [birth year] 47 y.o.  Admit date: 07/07/2012 Discharge date: 07/08/2012  Primary Care Physician: No primary provider on file. Primary EP: Ladona Ridgel, MD/Allred, MD   Primary Discharge Diagnosis:  1. Persistent AFib s/p EP study +RF ablation  Secondary Discharge Diagnoses:  1. Mild TR 2. Irritable bowel syndrome  Procedures This Admission:  PROCEDURES:  1. Comprehensive electrophysiologic study.  2. Coronary sinus pacing and recording.  3. Three-dimensional mapping of atrial fibrillation with additional mapping and ablation of a second discrete focus  4. Ablation of atrial fibrillation with additional mapping and ablation of a second discrete focus  5. Intracardiac echocardiography.  6. Transseptal puncture of an intact septum.  7. Rotational Angiography with processing at an independent workstation  8. Arrhythmia induction with pacing with isuprel infusion   History and Hospital Course:  Chad Braun is a 47 y.o. male with a history of atrial fibrillation who was initially diagnosed with atrial fibrillation after presenting with symptomatic palpitations and fatgiue. The patient reports increasing frequency and duration of atrial fibrillation since that time. He has been followed by Dr. Ladona Ridgel. He has failed medical therapy with flecainide and amiodarone. He was referred to Dr. Johney Frame for consideration of AFib ablation. After discussing all treatment options, he elected to proceed with an EP study +RF ablation. He underwent a pre-procedural TEE on 07/07/2012 which ruled out LA/LAA thrombus. He presented yesterday for catheter ablation of atrial fibrillation. Mr. Hlavac tolerated this procedure well without any immediate complication. He remains hemodynamically stable and afebrile. His groin site is intact without significant bleeding or hematoma. He has been observed on telemetry which  reveals sinus rhythm. He has been ambulating without difficulty. He has been given discharge instructions including wound care and activity restrictions. He will follow-up in clinic in 12 weeks. He has been seen, examined and deemed stable for discharge today by Dr. Lewayne Bunting.  Discharge Vitals: Blood pressure 115/75, pulse 62, temperature 97.8 F (36.6 C), temperature source Oral, resp. rate 20, height 5\' 10"  (1.778 m), weight 200 lb (90.719 kg), SpO2 87.00%.   Labs: Lab Results  Component Value Date   WBC 5.1 06/30/2012   HGB 15.3 06/30/2012   HCT 44.4 06/30/2012   MCV 91.9 06/30/2012   PLT 176.0 06/30/2012   No results found for this basename: NA,K,CL,CO2,BUN,CREATININE,CALCIUM,LABALBU,PROT,BILITOT,ALKPHOS,ALT,AST,GLUCOSE in the last 168 hours   Disposition:  The patient is being discharged in stable condition.  Follow-up:     Follow-up Information    Follow up with Hillis Range, MD. On 09/25/2012. (At 2:15 PM)    Contact information:   [address] [place] [address] [phone]       Discharge Medications:    Medication List     As of 07/08/2012  9:00 AM    TAKE these medications         amiodarone 200 MG tablet   Commonly known as: PACERONE   Take 1 tablet (200 mg total) by mouth daily. Continue 1 tablet (200 mg) by mouth once daily x 4 weeks then stop.      dabigatran 150 MG Caps   Commonly known as: PRADAXA   Take 150 mg by mouth every 12 (twelve) hours.      pantoprazole 40 MG tablet   Commonly known as: PROTONIX   Take 1 tablet (40 mg total) by mouth daily.        Duration of  Discharge Encounter: Greater than 30 minutes including physician time.  Signed, Murdock Jellison, Nehemiah Settle, PA-C 07/08/2012, 9:00 AM

## 2012-07-12 ENCOUNTER — Telehealth: Payer: Self-pay | Admitting: Cardiology

## 2012-07-12 NOTE — Telephone Encounter (Signed)
Returned call to Chad Braun. He underwent RFCA of a.fib on 07/07/12. He feels like he went back into a.fib yesterday morning (1/11). He denies chest pain, sob, dizziness, or syncope. He has continued taking his amiodarone and pradaxa. As long as he continues to feel well he will call the office tomorrow for further instructions, otherwise he will go to the ER.  [place], PA-C 07/12/2012 12:58 PM

## 2012-07-13 NOTE — Telephone Encounter (Signed)
Spoke with patient and he is still out of rhythm.  He will come by the office on Thursday to get and EKG and labs.  If still out of rhythm will schedule a DCCV on Friday

## 2012-07-13 NOTE — Telephone Encounter (Signed)
Tresa Endo,  Please call Mr Danzer this am to see how he is feeling.  If he is doing well, then he should wait until Thursday or Friday and undergo cardioversion. If he does not feel well, then he will need cardioversion today.

## 2012-07-13 NOTE — Telephone Encounter (Signed)
lmom for patient to return my call 

## 2012-07-15 ENCOUNTER — Telehealth: Payer: Self-pay | Admitting: Internal Medicine

## 2012-07-15 NOTE — Telephone Encounter (Signed)
Pt would like a call

## 2012-07-15 NOTE — Telephone Encounter (Signed)
Spoke to patient and he is back in NSR We will cx the DCCV for Fri

## 2012-07-20 ENCOUNTER — Telehealth: Payer: Self-pay | Admitting: Internal Medicine

## 2012-07-20 NOTE — Telephone Encounter (Signed)
Needs a letter for [employer] stating his condition(afib) and that it is under control and he is followed by you in clinic.  He will pick up  He is aware Dr Johney Frame out until next Monday

## 2012-07-20 NOTE — Telephone Encounter (Signed)
New Problem: ° ° ° °Patient called in wanting to speak with you.  Please call back. °

## 2012-08-03 ENCOUNTER — Encounter: Payer: Self-pay | Admitting: *Deleted

## 2012-08-03 NOTE — Telephone Encounter (Signed)
Letter ready and out front

## 2012-08-18 ENCOUNTER — Telehealth: Payer: Self-pay | Admitting: Internal Medicine

## 2012-08-18 NOTE — Telephone Encounter (Signed)
Will leave samples out front for patient

## 2012-08-18 NOTE — Telephone Encounter (Signed)
New problem    No information was disclose will discuss when Tresa Endo called back

## 2012-09-25 ENCOUNTER — Ambulatory Visit (INDEPENDENT_AMBULATORY_CARE_PROVIDER_SITE_OTHER): Payer: 59 | Admitting: Internal Medicine

## 2012-09-25 ENCOUNTER — Encounter: Payer: Self-pay | Admitting: Internal Medicine

## 2012-09-25 VITALS — BP 127/73 | HR 64 | Ht 70.0 in | Wt 206.6 lb

## 2012-09-25 DIAGNOSIS — I4891 Unspecified atrial fibrillation: Secondary | ICD-10-CM

## 2012-09-25 NOTE — Patient Instructions (Signed)
Your physician wants you to follow-up in: 6 months with Dr Jacquiline Doe will receive a reminder letter in the mail two months in advance. If you don't receive a letter, please call our office to schedule the follow-up appointment.    Your physician has recommended you make the following change in your medication:  1) Stop Pradaxa

## 2012-09-25 NOTE — Progress Notes (Signed)
PCP: No primary provider on file. Primary Cardiologist:  Chad Braun is a 47 y.o. male who presents today for routine electrophysiology followup.  Since last being seen in our clinic, the patient reports doing very well.  Today, he denies symptoms of palpitations, chest pain, shortness of breath,  lower extremity edema, dizziness, presyncope, or syncope.  The patient is otherwise without complaint today.   Past Medical History  Diagnosis Date  . Persistent atrial fibrillation   . IBS (irritable bowel syndrome)   . Mild tricuspid regurgitation   . IBS (irritable bowel syndrome)    Past Surgical History  Procedure Laterality Date  . Cardioversion    . Cardiac catheterization  01/21/11  . Tee without cardioversion  07/07/2012    Procedure: TRANSESOPHAGEAL ECHOCARDIOGRAM (TEE);  Surgeon: Dolores Patty, MD;  Location: [facility] ENDOSCOPY;  Service: Cardiovascular;  Laterality: N/A;  . Atrial fibrillation ablation  07/07/12    AFib ablation by Dr Johney Frame    No current outpatient prescriptions on file.   No current facility-administered medications for this visit.    Physical Exam: Filed Vitals:   09/25/12 1414  BP: 127/73  Pulse: 64  Height: 5\' 10"  (1.778 m)  Weight: 206 lb 9.6 oz (93.713 kg)    GEN- The patient is well appearing, alert and oriented x 3 today.   Head- normocephalic, atraumatic Eyes-  Sclera clear, conjunctiva pink Ears- hearing intact Oropharynx- clear Lungs- Clear to ausculation bilaterally, normal work of breathing Heart- Regular rate and rhythm, no murmurs, rubs or gallops, PMI not laterally displaced GI- soft, NT, ND, + BS Extremities- no clubbing, cyanosis, or edema  ekg today reveals sinus rhythm 64 bpm, early repolarization, otherwise normal ekg  Assessment and Plan:  1. afib Maintaining sinus rhythm off of AAD Stop pradaxa (CHADS2VASC score is 0)  Return in 6 months

## 2013-08-25 ENCOUNTER — Ambulatory Visit: Payer: 59 | Admitting: Internal Medicine

## 2013-10-08 ENCOUNTER — Ambulatory Visit (INDEPENDENT_AMBULATORY_CARE_PROVIDER_SITE_OTHER): Payer: 59 | Admitting: Internal Medicine

## 2013-10-08 ENCOUNTER — Encounter: Payer: Self-pay | Admitting: Internal Medicine

## 2013-10-08 VITALS — BP 117/70 | HR 65 | Ht 70.0 in | Wt 201.0 lb

## 2013-10-08 DIAGNOSIS — I4891 Unspecified atrial fibrillation: Secondary | ICD-10-CM

## 2013-10-08 NOTE — Patient Instructions (Signed)
Your physician wants you to follow-up in: 12 months with Dr Allred You will receive a reminder letter in the mail two months in advance. If you don't receive a letter, please call our office to schedule the follow-up appointment.  

## 2013-10-09 NOTE — Progress Notes (Signed)
   Chad Braun is a 48 y.o. male who presents today for routine electrophysiology followup.  Since last being seen in our clinic, the patient reports doing very well.  He has had no afib.  Today, he denies symptoms of palpitations, chest pain, shortness of breath,  lower extremity edema, dizziness, presyncope, or syncope.  The patient is otherwise without complaint today.   Past Medical History  Diagnosis Date  . Persistent atrial fibrillation   . IBS (irritable bowel syndrome)   . Mild tricuspid regurgitation   . IBS (irritable bowel syndrome)    Past Surgical History  Procedure Laterality Date  . Cardioversion    . Cardiac catheterization  01/21/11  . Tee without cardioversion  07/07/2012    Procedure: TRANSESOPHAGEAL ECHOCARDIOGRAM (TEE);  Surgeon: Dolores Pattyaniel R Bensimhon, MD;  [facility];  Service: Cardiovascular;  Laterality: N/A;  . Atrial fibrillation ablation  07/07/12    AFib ablation by Dr Johney FrameAllred    No current outpatient prescriptions on file.   No current facility-administered medications for this visit.    Physical Exam: Filed Vitals:   10/08/13 1644  BP: 117/70  Pulse: 65  Height: 5\' 10"  (1.778 m)  Weight: 201 lb (91.173 kg)    GEN- The patient is well appearing, alert and oriented x 3 today.   Head- normocephalic, atraumatic Eyes-  Sclera clear, conjunctiva pink Ears- hearing intact Oropharynx- clear Lungs- Clear to ausculation bilaterally, normal work of breathing Heart- Regular rate and rhythm, no murmurs, rubs or gallops, PMI not laterally displaced GI- soft, NT, ND, + BS Extremities- no clubbing, cyanosis, or edema  ekg today reveals sinus rhythm   Assessment and Plan:  1. afib Maintaining sinus rhythm off of AAD Stop pradaxa (CHADS2VASC score is 0)  Return in12 months

## 2013-10-13 ENCOUNTER — Ambulatory Visit: Payer: 59 | Admitting: Internal Medicine

## 2013-11-01 ENCOUNTER — Telehealth: Payer: Self-pay | Admitting: Internal Medicine

## 2013-11-01 ENCOUNTER — Encounter: Payer: Self-pay | Admitting: *Deleted

## 2013-11-01 NOTE — Telephone Encounter (Signed)
Letter sent.

## 2013-11-01 NOTE — Telephone Encounter (Signed)
°  Patient needs a note stating that he is cleared to do all activities. He states he has to do this yearly. That letter can be faxed too [phone] ( attn: Katharina CaperBeverly Knott) Please advise. Any questions please call patient.

## 2014-04-15 NOTE — Telephone Encounter (Signed)
Nothing was typed/tmj 

## 2014-06-09 ENCOUNTER — Encounter (HOSPITAL_COMMUNITY): Payer: Self-pay | Admitting: Internal Medicine

## 2014-10-24 ENCOUNTER — Encounter: Payer: Self-pay | Admitting: *Deleted

## 2014-10-24 ENCOUNTER — Ambulatory Visit (INDEPENDENT_AMBULATORY_CARE_PROVIDER_SITE_OTHER): Payer: 59 | Admitting: Internal Medicine

## 2014-10-24 ENCOUNTER — Encounter: Payer: Self-pay | Admitting: Internal Medicine

## 2014-10-24 VITALS — BP 96/62 | HR 69 | Ht 70.0 in | Wt 202.6 lb

## 2014-10-24 DIAGNOSIS — I480 Paroxysmal atrial fibrillation: Secondary | ICD-10-CM

## 2014-10-24 NOTE — Progress Notes (Signed)
ZO:[id number]:afib  Chad Braun is a 49 y.o. male who presents today for routine electrophysiology followup.  Since last being seen in our clinic, the patient reports doing very well.  He has had no afib.  Today, he denies symptoms of palpitations, chest pain, shortness of breath,  lower extremity edema, dizziness, presyncope, or syncope.  The patient is otherwise without complaint today.   Past Medical History  Diagnosis Date  . Persistent atrial fibrillation   . IBS (irritable bowel syndrome)   . Mild tricuspid regurgitation   . IBS (irritable bowel syndrome)    Past Surgical History  Procedure Laterality Date  . Cardioversion    . Cardiac catheterization  01/21/11  . Tee without cardioversion  07/07/2012    Procedure: TRANSESOPHAGEAL ECHOCARDIOGRAM (TEE);  Surgeon: Dolores Pattyaniel R Bensimhon, MD;  [facility];  Service: Cardiovascular;  Laterality: N/A;  . Atrial fibrillation ablation  07/07/12    AFib ablation by Dr Johney FrameAllred  . Atrial fibrillation ablation N/A 07/07/2012    Procedure: ATRIAL FIBRILLATION ABLATION;  Surgeon: Hillis RangeJames Taequan Stockhausen, MD;  [facility];  Service: Cardiovascular;  Laterality: N/A;    No current outpatient prescriptions on file.   No current facility-administered medications for this visit.    Physical Exam: Filed Vitals:   10/24/14 1548  BP: 96/62  Pulse: 69  Height: 5\' 10"  (1.778 m)  Weight: 202 lb 9.6 oz (91.899 kg)    GEN- The patient is well appearing, alert and oriented x 3 today.   Head- normocephalic, atraumatic Eyes-  Sclera clear, conjunctiva pink Ears- hearing intact Oropharynx- clear Lungs- Clear to ausculation bilaterally, normal work of breathing Heart- Regular rate and rhythm, no murmurs, rubs or gallops, PMI not laterally displaced GI- soft, NT, ND, + BS Extremities- no clubbing, cyanosis, or edema  ekg today reveals sinus rhythm   Assessment and Plan:  1. afib Maintaining sinus rhythm off of AAD CHADS2VASC score is 0  Return  in12 months

## 2014-10-24 NOTE — Patient Instructions (Signed)

## 2015-08-02 ENCOUNTER — Ambulatory Visit (INDEPENDENT_AMBULATORY_CARE_PROVIDER_SITE_OTHER): Payer: Commercial Managed Care - HMO | Admitting: Family Medicine

## 2015-08-02 ENCOUNTER — Encounter: Payer: Self-pay | Admitting: Family Medicine

## 2015-08-02 VITALS — BP 124/69 | HR 61 | Ht 70.0 in | Wt 195.0 lb

## 2015-08-02 DIAGNOSIS — M79609 Pain in unspecified limb: Secondary | ICD-10-CM

## 2015-08-02 DIAGNOSIS — R202 Paresthesia of skin: Secondary | ICD-10-CM

## 2015-08-02 MED ORDER — PREDNISONE 10 MG PO TABS: ORAL_TABLET | ORAL | Status: DC

## 2015-08-02 NOTE — Progress Notes (Signed)
WYNDELL CARDIFF - 50 y.o. male MRN [id number]  Date of birth: 12-16-1965  Chad Braun is a 50 y.o. who presents today for radiculopathy down the left leg.  Paresthesias, visit to/07/2015-patient states that he has been having 2 months of paresthesias going down the posterior lateral aspect of his left leg. No previous injury and denies any specific back pain. Pain is worse when he flexes forward but is all right when he sits. 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 Still able to left. Other than Aleve he has not tried anything for the pain.  Pt denies any current bowel/bladder problems, fever, chills, unintentional weight loss, night time awakenings secondary to pain, weakness in one or both legs   Past Medical History  Diagnosis Date  . Persistent atrial fibrillation (HCC)   . IBS (irritable bowel syndrome)   . Mild tricuspid regurgitation   . IBS (irritable bowel syndrome)   ,  Family History  Problem Relation Age of Onset  . Breast cancer Mother   . Lung cancer Mother   . Colon cancer Father   ,  Social History   Social History  . Marital Status: Married    Spouse Name: N/A  . Number of Children: N/A  . Years of Education: N/A   Occupational History  .      Engineer, structural   Social History Main Topics  . Smoking status: Never Smoker   . Smokeless tobacco: Never Used  . Alcohol Use: 0.6 oz/week    1 Cans of beer per week     Comment: one per week  . Drug Use: No  . Sexual Activity: Not on file   Other Topics Concern  . Not on file   Social History Narrative   Works as a Contractor.  ,  Past Surgical History  Procedure Laterality Date  . Cardioversion    . Cardiac catheterization  01/21/11  . Tee without cardioversion  07/07/2012    Procedure: TRANSESOPHAGEAL ECHOCARDIOGRAM (TEE);  Surgeon: Dolores Patty, MD;  Location: [facility] ENDOSCOPY;  Service: Cardiovascular;  Laterality: N/A;   . Atrial fibrillation ablation  07/07/12    AFib ablation by Dr Johney Frame  . Atrial fibrillation ablation N/A 07/07/2012    Procedure: ATRIAL FIBRILLATION ABLATION;  Surgeon: Hillis Range, MD;  Location: [facility] CATH LAB;  Service: Cardiovascular;  Laterality: N/A;    12 point ROS negative other than per HPI.   Physical Exam Filed Vitals:   08/02/15 1519  BP: 124/69  Pulse: 61    Gen: NAD, AAO 3 Cardiorespiratory - Normal respiratory effort/rate.  RRR Skin: No rashes or erythema Extremities: No edema, pulses +2 bilateral upper and lower extremity Neuro: CN 2-12 intact, MS 5/5 B/L UE and LE, +2 patellar and achilles relfex b/l  Back Exam: 1.Gait  1. Walk on heels (L5 root)  Normal   Walk on toes (S1 root)  Normal  2. TTP along Lumbar Vertebrae - Negative  3. Pain with :   1) Extension -Negative   2) Flexion - Negative  4. One Legged Hyperextension for Spondy - Negative  5. Straight Leg Raise - Positive @ 45 L   1) Radiation into opposite leg - Negative   2) Worse with Dorsiflexion of ankle - Negative  6. Sitting Leg Raise - Positive @ 45 L  7. DTR - +2/4 Patellar/Achilles 8. MS - 5/5 L2-S1 myotome strength B/L  9.  Vascular Exam : DP and PT +2 B/L

## 2015-08-02 NOTE — Addendum Note (Signed)
Addended by: Annita Brod on: 08/02/2015 03:38 PM   Modules accepted: Orders

## 2015-08-02 NOTE — Assessment & Plan Note (Signed)
Concern for degenerative disc disease. No red flags on exam or history today. Two-view lumbar spine. Sterapred dose pack. Consider Tylenol, NSAIDs, physical therapy, MRI/epidural.  Follow-up in 2 weeks

## 2015-08-14 ENCOUNTER — Ambulatory Visit
Admission: RE | Admit: 2015-08-14 | Discharge: 2015-08-14 | Disposition: A | Discharge status: Home / Self Care | Payer: Commercial Managed Care - HMO | Source: Ambulatory Visit | Attending: Family Medicine | Admitting: Family Medicine

## 2015-08-14 DIAGNOSIS — M79609 Pain in unspecified limb: Secondary | ICD-10-CM

## 2015-08-14 DIAGNOSIS — R202 Paresthesia of skin: Principal | ICD-10-CM

## 2015-08-21 ENCOUNTER — Encounter: Payer: Self-pay | Admitting: Internal Medicine

## 2015-08-21 ENCOUNTER — Encounter: Payer: Self-pay | Admitting: *Deleted

## 2015-08-21 ENCOUNTER — Other Ambulatory Visit: Payer: Self-pay

## 2015-08-21 ENCOUNTER — Ambulatory Visit (INDEPENDENT_AMBULATORY_CARE_PROVIDER_SITE_OTHER): Payer: Commercial Managed Care - HMO | Admitting: Internal Medicine

## 2015-08-21 VITALS — BP 106/70 | HR 59 | Ht 70.0 in | Wt 199.4 lb

## 2015-08-21 DIAGNOSIS — I480 Paroxysmal atrial fibrillation: Secondary | ICD-10-CM

## 2015-08-21 NOTE — Patient Instructions (Signed)

## 2015-08-21 NOTE — Progress Notes (Signed)
UJ:[id number]  Chad Braun is a 50 y.o. male who presents today for routine electrophysiology followup.  Since last being seen in our clinic, the patient reports doing very well.  He has had no afib.  He was able to ride in a 50 mile mountain bike race this past year.  He is planning for a 100 mile race in September.  Today, he denies symptoms of palpitations, chest pain, shortness of breath,  lower extremity edema, dizziness, presyncope, or syncope.  The patient is otherwise without complaint today.   Past Medical History  Diagnosis Date  . Persistent atrial fibrillation (HCC)   . IBS (irritable bowel syndrome)   . Mild tricuspid regurgitation   . IBS (irritable bowel syndrome)    Past Surgical History  Procedure Laterality Date  . Cardioversion    . Cardiac catheterization  01/21/11  . Tee without cardioversion  07/07/2012    Procedure: TRANSESOPHAGEAL ECHOCARDIOGRAM (TEE);  Surgeon: Dolores Patty, MD;  Location: [facility] ENDOSCOPY;  Service: Cardiovascular;  Laterality: N/A;  . Atrial fibrillation ablation  07/07/12    AFib ablation by Dr Johney Frame  . Atrial fibrillation ablation N/A 07/07/2012    Procedure: ATRIAL FIBRILLATION ABLATION;  Surgeon: Hillis Range, MD;  Location: [facility] CATH LAB;  Service: Cardiovascular;  Laterality: N/A;    No current outpatient prescriptions on file.   No current facility-administered medications for this visit.    Physical Exam: Filed Vitals:   08/21/15 1533  BP: 106/70  Pulse: 59  Height:  (1.778 m)  Weight: 199 lb 6.4 oz (90.447 kg)    GEN- The patient is well appearing, alert and oriented x 3 today.   Head- normocephalic, atraumatic Eyes-  Sclera clear, conjunctiva pink Ears- hearing intact Oropharynx- clear Lungs- Clear to ausculation bilaterally, normal work of breathing Heart- Regular rate and rhythm, no murmurs, rubs or gallops, PMI not laterally displaced GI- soft, NT, ND, + BS Extremities- no clubbing, cyanosis, or edema  ekg today  reveals sinus rhythm , LVH with repolarization changes  Assessment and Plan:  1. afib Maintaining sinus rhythm off of AAD CHADS2VASC score is 0 Repeat echo upon return  Return in12 months  Hillis Range MD, [facility] 08/21/2015 4:05 PM

## 2015-08-23 ENCOUNTER — Ambulatory Visit (INDEPENDENT_AMBULATORY_CARE_PROVIDER_SITE_OTHER): Payer: Commercial Managed Care - HMO | Admitting: Family Medicine

## 2015-08-23 VITALS — BP 119/78 | Ht 70.0 in | Wt 195.0 lb

## 2015-08-23 DIAGNOSIS — M79609 Pain in unspecified limb: Secondary | ICD-10-CM | POA: Diagnosis not present

## 2015-08-23 DIAGNOSIS — R202 Paresthesia of skin: Secondary | ICD-10-CM

## 2015-08-23 NOTE — Progress Notes (Signed)
Chad Braun - 50 y.o. male MRN [id number]  Date of birth: Aug 19, 1965  Chad Braun is a 50 y.o. who presents today for radiculopathy down the left leg.  Paresthesias, visit 08/02/2015-patient states that he has been having 2 months of paresthesias going down the posterior lateral aspect of his left leg. No previous injury and denies any specific back pain. Pain is worse when he flexes forward but is all right when he sits. 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 Still able to left. Other than Aleve he has not tried anything for the pain.  Pt denies any current bowel/bladder problems, fever, chills, unintentional weight loss, night time awakenings secondary to pain, weakness in one or both legs  08/23/15 - Pt states he is doing somewhat better today despite not taking the prednisone.  He is interested in other options other than medication.  Denies any red flags today.   Past Medical History  Diagnosis Date  . Persistent atrial fibrillation (HCC)   . IBS (irritable bowel syndrome)   . Mild tricuspid regurgitation   . IBS (irritable bowel syndrome)   ,  Family History  Problem Relation Age of Onset  . Breast cancer Mother   . Lung cancer Mother   . Colon cancer Father   ,  Social History   Social History  . Marital Status: Married    Spouse Name: N/A  . Number of Children: N/A  . Years of Education: N/A   Occupational History  .      Engineer, structural   Social History Main Topics  . Smoking status: Never Smoker   . Smokeless tobacco: Never Used  . Alcohol Use: 0.6 oz/week    1 Cans of beer per week     Comment: one per week  . Drug Use: No  . Sexual Activity: Not on file   Other Topics Concern  . Not on file   Social History Narrative   Works as a Contractor.  ,  Past Surgical History  Procedure Laterality Date  . Cardioversion    . Cardiac catheterization  01/21/11  . Tee without  cardioversion  07/07/2012    Procedure: TRANSESOPHAGEAL ECHOCARDIOGRAM (TEE);  Surgeon: Dolores Patty, MD;  Location: [facility] ENDOSCOPY;  Service: Cardiovascular;  Laterality: N/A;  . Atrial fibrillation ablation  07/07/12    AFib ablation by Dr Johney Frame  . Atrial fibrillation ablation N/A 07/07/2012    Procedure: ATRIAL FIBRILLATION ABLATION;  Surgeon: Hillis Range, MD;  Location: [facility] CATH LAB;  Service: Cardiovascular;  Laterality: N/A;    12 point ROS negative other than per HPI.   Physical Exam Filed Vitals:   08/23/15 1520  BP: 119/78    Gen: NAD, AAO 3 Cardiorespiratory - Normal respiratory effort/rate.  RRR Skin: No rashes or erythema Extremities: No edema, pulses +2 bilateral upper and lower extremity Neuro: CN 2-12 intact, MS 5/5 B/L UE and LE, +2 patellar and achilles relfex b/l  Back Exam: 1.Gait  1. Walk on heels (L5 root)  Normal   Walk on toes (S1 root)  Normal  2. TTP along Lumbar Vertebrae - Negative  3. Pain with :   1) Extension -Negative   2) Flexion - Negative  4. One Legged Hyperextension for Spondy - Negative  5. Straight Leg Raise - Negative  1) Radiation into opposite leg - Negative   2) Worse with Dorsiflexion of ankle - Negative  6. Sitting Leg Raise - Negative  7. DTR - +2/4 Patellar/Achilles 8. MS - 5/5 L2-S1 myotome strength B/L  9. 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

## 2015-08-23 NOTE — Assessment & Plan Note (Signed)
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  Sx slightly resolving at this time - PT to help strengthen core - If no improvement in 4 weeks, consider MRI with eventual epidural/neurosurg.

## 2015-10-04 ENCOUNTER — Ambulatory Visit: Payer: Commercial Managed Care - HMO | Admitting: Family Medicine

## 2015-11-08 ENCOUNTER — Emergency Department (HOSPITAL_COMMUNITY): Payer: Commercial Managed Care - HMO | Admitting: Certified Registered Nurse Anesthetist

## 2015-11-08 ENCOUNTER — Ambulatory Visit (HOSPITAL_COMMUNITY)
Admission: EM | Admit: 2015-11-08 | Discharge: 2015-11-09 | Disposition: A | Discharge status: Home / Self Care | Payer: Commercial Managed Care - HMO | Attending: General Surgery | Admitting: General Surgery

## 2015-11-08 ENCOUNTER — Other Ambulatory Visit: Payer: Self-pay | Admitting: Gastroenterology

## 2015-11-08 ENCOUNTER — Encounter (HOSPITAL_COMMUNITY)
Admission: EM | Disposition: A | Discharge status: Home / Self Care | Payer: Self-pay | Source: Home / Self Care | Attending: Emergency Medicine

## 2015-11-08 ENCOUNTER — Encounter (HOSPITAL_COMMUNITY): Payer: Self-pay | Admitting: Family Medicine

## 2015-11-08 ENCOUNTER — Ambulatory Visit
Admission: RE | Admit: 2015-11-08 | Discharge: 2015-11-08 | Disposition: A | Discharge status: Home / Self Care | Payer: Commercial Managed Care - HMO | Source: Ambulatory Visit | Attending: Gastroenterology | Admitting: Gastroenterology

## 2015-11-08 DIAGNOSIS — I071 Rheumatic tricuspid insufficiency: Secondary | ICD-10-CM | POA: Diagnosis not present

## 2015-11-08 DIAGNOSIS — K353 Acute appendicitis with localized peritonitis, without perforation or gangrene: Secondary | ICD-10-CM

## 2015-11-08 DIAGNOSIS — K3580 Unspecified acute appendicitis: Secondary | ICD-10-CM | POA: Diagnosis present

## 2015-11-08 DIAGNOSIS — R1031 Right lower quadrant pain: Secondary | ICD-10-CM

## 2015-11-08 DIAGNOSIS — I481 Persistent atrial fibrillation: Secondary | ICD-10-CM | POA: Insufficient documentation

## 2015-11-08 DIAGNOSIS — R109 Unspecified abdominal pain: Secondary | ICD-10-CM | POA: Diagnosis present

## 2015-11-08 HISTORY — PX: APPENDECTOMY: SHX54

## 2015-11-08 HISTORY — DX: Gastro-esophageal reflux disease without esophagitis: K21.9

## 2015-11-08 HISTORY — PX: LAPAROSCOPIC APPENDECTOMY: SHX408

## 2015-11-08 LAB — COMPREHENSIVE METABOLIC PANEL
ALK PHOS: 114 U/L (ref 38–126)
ALT: 21 U/L (ref 17–63)
AST: 23 U/L (ref 15–41)
Albumin: 3.7 g/dL (ref 3.5–5.0)
Anion gap: 9 (ref 5–15)
BILIRUBIN TOTAL: 1.2 mg/dL (ref 0.3–1.2)
BUN: 9 mg/dL (ref 6–20)
CO2: 26 mmol/L (ref 22–32)
CREATININE: 0.8 mg/dL (ref 0.61–1.24)
Calcium: 9.1 mg/dL (ref 8.9–10.3)
Chloride: 102 mmol/L (ref 101–111)
GFR calc Af Amer: >60 mL/min (ref >60)
Glucose, Bld: 93 mg/dL (ref 65–99)
Potassium: 3.9 mmol/L (ref 3.5–5.1)
Sodium: 137 mmol/L (ref 135–145)
TOTAL PROTEIN: 6.6 g/dL (ref 6.5–8.1)

## 2015-11-08 LAB — CBC
HCT: 41.6 % (ref 39.0–52.0)
Hemoglobin: 14 g/dL (ref 13.0–17.0)
MCH: 30.4 pg (ref 26.0–34.0)
MCHC: 33.7 g/dL (ref 30.0–36.0)
MCV: 90.2 fL (ref 78.0–100.0)
PLATELETS: 189 10*3/uL (ref 150–400)
RBC: 4.61 MIL/uL (ref 4.22–5.81)
RDW: 12.8 % (ref 11.5–15.5)
WBC: 9 10*3/uL (ref 4.0–10.5)

## 2015-11-08 SURGERY — APPENDECTOMY, LAPAROSCOPIC
Anesthesia: General | Site: Abdomen

## 2015-11-08 MED ORDER — HYDROMORPHONE HCL 1 MG/ML IJ SOLN
0.2500 mg | INTRAMUSCULAR | Status: DC | PRN
  Administered 2015-11-08 (×2): 0.5 mg via INTRAVENOUS

## 2015-11-08 MED ORDER — FENTANYL CITRATE (PF) 100 MCG/2ML IJ SOLN
INTRAMUSCULAR | Status: DC | PRN
  Administered 2015-11-08: 100 ug via INTRAVENOUS
  Administered 2015-11-08 (×2): 50 ug via INTRAVENOUS

## 2015-11-08 MED ORDER — PHENYLEPHRINE 40 MCG/ML (10ML) SYRINGE FOR IV PUSH (FOR BLOOD PRESSURE SUPPORT)
PREFILLED_SYRINGE | INTRAVENOUS | Status: AC
  Filled 2015-11-08: qty 10

## 2015-11-08 MED ORDER — VECURONIUM BROMIDE 10 MG IV SOLR
INTRAVENOUS | Status: AC
  Filled 2015-11-08: qty 10

## 2015-11-08 MED ORDER — DEXTROSE 5 % IV SOLN
1.0000 g | Freq: Four times a day (QID) | INTRAVENOUS | Status: AC
  Administered 2015-11-08 – 2015-11-09 (×2): 1 g via INTRAVENOUS
  Filled 2015-11-08 (×2): qty 1

## 2015-11-08 MED ORDER — HYDROMORPHONE HCL 1 MG/ML IJ SOLN
INTRAMUSCULAR | Status: AC
  Administered 2015-11-08: 0.5 mg via INTRAVENOUS
  Filled 2015-11-08: qty 1

## 2015-11-08 MED ORDER — DEXTROSE 5 % IV SOLN
1.0000 g | Freq: Once | INTRAVENOUS | Status: AC
  Administered 2015-11-08: 1 g via INTRAVENOUS
  Filled 2015-11-08: qty 1

## 2015-11-08 MED ORDER — SUCCINYLCHOLINE CHLORIDE 20 MG/ML IJ SOLN
INTRAMUSCULAR | Status: DC | PRN
  Administered 2015-11-08: 100 mg via INTRAVENOUS

## 2015-11-08 MED ORDER — ONDANSETRON 4 MG PO TBDP: 4.0000 mg | ORAL_TABLET | Freq: Four times a day (QID) | ORAL | Status: DC | PRN

## 2015-11-08 MED ORDER — ONDANSETRON HCL 4 MG/2ML IJ SOLN
INTRAMUSCULAR | Status: DC | PRN
  Administered 2015-11-08: 4 mg via INTRAVENOUS

## 2015-11-08 MED ORDER — SODIUM CHLORIDE 0.9 % IJ SOLN
INTRAMUSCULAR | Status: AC
  Filled 2015-11-08: qty 20

## 2015-11-08 MED ORDER — OXYCODONE HCL 5 MG PO TABS: 5.0000 mg | ORAL_TABLET | ORAL | Status: DC | PRN

## 2015-11-08 MED ORDER — ESMOLOL HCL 100 MG/10ML IV SOLN
INTRAVENOUS | Status: AC
  Filled 2015-11-08: qty 10

## 2015-11-08 MED ORDER — 0.9 % SODIUM CHLORIDE (POUR BTL) OPTIME
TOPICAL | Status: DC | PRN
  Administered 2015-11-08: 1000 mL

## 2015-11-08 MED ORDER — SUGAMMADEX SODIUM 200 MG/2ML IV SOLN
INTRAVENOUS | Status: DC | PRN
  Administered 2015-11-08: 200 mg via INTRAVENOUS

## 2015-11-08 MED ORDER — METHOCARBAMOL 500 MG PO TABS
500.0000 mg | ORAL_TABLET | Freq: Four times a day (QID) | ORAL | Status: DC | PRN
  Administered 2015-11-08 – 2015-11-09 (×2): 500 mg via ORAL
  Filled 2015-11-08 (×2): qty 1

## 2015-11-08 MED ORDER — MIDAZOLAM HCL 5 MG/5ML IJ SOLN
INTRAMUSCULAR | Status: DC | PRN
  Administered 2015-11-08 (×2): 1 mg via INTRAVENOUS

## 2015-11-08 MED ORDER — ENOXAPARIN SODIUM 40 MG/0.4ML ~~LOC~~ SOLN
40.0000 mg | SUBCUTANEOUS | Status: DC
Start: 2015-11-09 — End: 2015-11-09

## 2015-11-08 MED ORDER — ONDANSETRON HCL 4 MG/2ML IJ SOLN
INTRAMUSCULAR | Status: AC
  Filled 2015-11-08: qty 2

## 2015-11-08 MED ORDER — SODIUM CHLORIDE 0.9 % IR SOLN
Status: DC | PRN
  Administered 2015-11-08: 1000 mL

## 2015-11-08 MED ORDER — EPHEDRINE 5 MG/ML INJ
INTRAVENOUS | Status: AC
Start: 2015-11-08 — End: 2015-11-08
  Filled 2015-11-08: qty 20

## 2015-11-08 MED ORDER — VECURONIUM BROMIDE 10 MG IV SOLR
INTRAVENOUS | Status: DC | PRN
  Administered 2015-11-08: 5 mg via INTRAVENOUS
  Administered 2015-11-08 (×2): 2 mg via INTRAVENOUS

## 2015-11-08 MED ORDER — POLYETHYLENE GLYCOL 3350 17 G PO PACK: 17.0000 g | PACK | Freq: Every day | ORAL | Status: DC | PRN

## 2015-11-08 MED ORDER — HYDROMORPHONE HCL 1 MG/ML IJ SOLN: 1.0000 mg | INTRAMUSCULAR | Status: DC | PRN

## 2015-11-08 MED ORDER — KCL IN DEXTROSE-NACL 20-5-0.45 MEQ/L-%-% IV SOLN
INTRAVENOUS | Status: DC
  Administered 2015-11-08: 23:00:00 via INTRAVENOUS
  Filled 2015-11-08: qty 1000

## 2015-11-08 MED ORDER — IOPAMIDOL (ISOVUE-300) INJECTION 61%
100.0000 mL | Freq: Once | INTRAVENOUS | Status: AC | PRN
  Administered 2015-11-08: 100 mL via INTRAVENOUS

## 2015-11-08 MED ORDER — FENTANYL CITRATE (PF) 100 MCG/2ML IJ SOLN
50.0000 ug | INTRAMUSCULAR | Status: DC | PRN
  Administered 2015-11-08: 50 ug via INTRAVENOUS
  Filled 2015-11-08: qty 2

## 2015-11-08 MED ORDER — PROPOFOL 10 MG/ML IV BOLUS
INTRAVENOUS | Status: AC
  Filled 2015-11-08: qty 20

## 2015-11-08 MED ORDER — SODIUM CHLORIDE 0.9 % IV BOLUS (SEPSIS)
1000.0000 mL | Freq: Once | INTRAVENOUS | Status: AC
Start: 2015-11-08 — End: 2015-11-08
  Administered 2015-11-08: 1000 mL via INTRAVENOUS

## 2015-11-08 MED ORDER — LIDOCAINE HCL (CARDIAC) 20 MG/ML IV SOLN
INTRAVENOUS | Status: DC | PRN
  Administered 2015-11-08: 100 mg via INTRAVENOUS

## 2015-11-08 MED ORDER — ZOLPIDEM TARTRATE 5 MG PO TABS: 5.0000 mg | ORAL_TABLET | Freq: Every evening | ORAL | Status: DC | PRN

## 2015-11-08 MED ORDER — EPHEDRINE SULFATE 50 MG/ML IJ SOLN
INTRAMUSCULAR | Status: DC | PRN
  Administered 2015-11-08: 10 mg via INTRAVENOUS
  Administered 2015-11-08: 5 mg via INTRAVENOUS

## 2015-11-08 MED ORDER — FENTANYL CITRATE (PF) 250 MCG/5ML IJ SOLN
INTRAMUSCULAR | Status: AC
  Filled 2015-11-08: qty 5

## 2015-11-08 MED ORDER — PROPOFOL 10 MG/ML IV BOLUS
INTRAVENOUS | Status: DC | PRN
  Administered 2015-11-08: 170 mg via INTRAVENOUS

## 2015-11-08 MED ORDER — LACTATED RINGERS IV SOLN
INTRAVENOUS | Status: DC | PRN
  Administered 2015-11-08 (×2): via INTRAVENOUS

## 2015-11-08 MED ORDER — BUPIVACAINE-EPINEPHRINE (PF) 0.25% -1:200000 IJ SOLN
INTRAMUSCULAR | Status: AC
  Filled 2015-11-08: qty 30

## 2015-11-08 MED ORDER — CEFAZOLIN SODIUM 1 G IJ SOLR
INTRAMUSCULAR | Status: AC
  Filled 2015-11-08: qty 20

## 2015-11-08 MED ORDER — ACETAMINOPHEN 500 MG PO TABS
1000.0000 mg | ORAL_TABLET | Freq: Four times a day (QID) | ORAL | Status: DC
  Administered 2015-11-08 – 2015-11-09 (×2): 1000 mg via ORAL
  Filled 2015-11-08 (×2): qty 2

## 2015-11-08 MED ORDER — LIDOCAINE 2% (20 MG/ML) 5 ML SYRINGE
INTRAMUSCULAR | Status: AC
  Filled 2015-11-08: qty 15

## 2015-11-08 MED ORDER — ONDANSETRON HCL 4 MG/2ML IJ SOLN: 4.0000 mg | Freq: Four times a day (QID) | INTRAMUSCULAR | Status: DC | PRN

## 2015-11-08 MED ORDER — ETOMIDATE 2 MG/ML IV SOLN
INTRAVENOUS | Status: AC
  Filled 2015-11-08: qty 10

## 2015-11-08 MED ORDER — MIDAZOLAM HCL 2 MG/2ML IJ SOLN
INTRAMUSCULAR | Status: AC
  Filled 2015-11-08: qty 2

## 2015-11-08 MED ORDER — BUPIVACAINE-EPINEPHRINE 0.25% -1:200000 IJ SOLN
INTRAMUSCULAR | Status: DC | PRN
  Administered 2015-11-08: 10 mL

## 2015-11-08 SURGICAL SUPPLY — 41 items
APPLIER CLIP ROT 10 11.4 M/L (STAPLE)
BLADE SURG ROTATE 9660 (MISCELLANEOUS) ×3 IMPLANT
CANISTER SUCTION 2500CC (MISCELLANEOUS) ×3 IMPLANT
CHLORAPREP W/TINT 26ML (MISCELLANEOUS) ×3 IMPLANT
CLIP APPLIE ROT 10 11.4 M/L (STAPLE) IMPLANT
CLOSURE WOUND 1/2 X4 (GAUZE/BANDAGES/DRESSINGS) ×1
COVER SURGICAL LIGHT HANDLE (MISCELLANEOUS) ×3 IMPLANT
CUTTER FLEX LINEAR 45M (STAPLE) ×3 IMPLANT
DERMABOND ADVANCED (GAUZE/BANDAGES/DRESSINGS) ×2
DERMABOND ADVANCED .7 DNX12 (GAUZE/BANDAGES/DRESSINGS) ×1 IMPLANT
DRSG TEGADERM 2-3/8X2-3/4 SM (GAUZE/BANDAGES/DRESSINGS) ×3 IMPLANT
ELECT REM PT RETURN 9FT ADLT (ELECTROSURGICAL) ×3
ELECTRODE REM PT RTRN 9FT ADLT (ELECTROSURGICAL) ×1 IMPLANT
ENDOLOOP SUT PDS II  0 18 (SUTURE)
ENDOLOOP SUT PDS II 0 18 (SUTURE) IMPLANT
GLOVE BIOGEL PI IND STRL 8 (GLOVE) ×1 IMPLANT
GLOVE BIOGEL PI INDICATOR 8 (GLOVE) ×2
GLOVE ECLIPSE 7.5 STRL STRAW (GLOVE) ×3 IMPLANT
GOWN STRL REUS W/ TWL LRG LVL3 (GOWN DISPOSABLE) ×3 IMPLANT
GOWN STRL REUS W/TWL LRG LVL3 (GOWN DISPOSABLE) ×6
KIT BASIN OR (CUSTOM PROCEDURE TRAY) ×3 IMPLANT
KIT ROOM TURNOVER OR (KITS) ×3 IMPLANT
NS IRRIG 1000ML POUR BTL (IV SOLUTION) ×3 IMPLANT
PAD ARMBOARD 7.5X6 YLW CONV (MISCELLANEOUS) ×6 IMPLANT
POUCH SPECIMEN RETRIEVAL 10MM (ENDOMECHANICALS) ×3 IMPLANT
RELOAD 45 VASCULAR/THIN (ENDOMECHANICALS) IMPLANT
RELOAD STAPLE TA45 3.5 REG BLU (ENDOMECHANICALS) ×3 IMPLANT
SCALPEL HARMONIC ACE (MISCELLANEOUS) ×3 IMPLANT
SET IRRIG TUBING LAPAROSCOPIC (IRRIGATION / IRRIGATOR) ×3 IMPLANT
SLEEVE ENDOPATH XCEL 5M (ENDOMECHANICALS) ×3 IMPLANT
SPECIMEN JAR SMALL (MISCELLANEOUS) ×3 IMPLANT
STRIP CLOSURE SKIN 1/2X4 (GAUZE/BANDAGES/DRESSINGS) ×2 IMPLANT
SUT MNCRL AB 4-0 PS2 18 (SUTURE) ×3 IMPLANT
TOWEL OR 17X24 6PK STRL BLUE (TOWEL DISPOSABLE) ×3 IMPLANT
TOWEL OR 17X26 10 PK STRL BLUE (TOWEL DISPOSABLE) ×3 IMPLANT
TRAY FOLEY CATH 16FR SILVER (SET/KITS/TRAYS/PACK) ×3 IMPLANT
TRAY FOLEY CATH SILVER 16FR (SET/KITS/TRAYS/PACK) ×3 IMPLANT
TRAY LAPAROSCOPIC MC (CUSTOM PROCEDURE TRAY) ×3 IMPLANT
TROCAR XCEL BLUNT TIP 100MML (ENDOMECHANICALS) ×3 IMPLANT
TROCAR XCEL NON-BLD 5MMX100MML (ENDOMECHANICALS) ×3 IMPLANT
TUBING INSUFFLATION (TUBING) ×3 IMPLANT

## 2015-11-08 NOTE — ED Provider Notes (Signed)
CSN: [id number]     Arrival date & time 11/08/15  1634 History   First MD Initiated Contact with Patient 11/08/15 1654     Chief Complaint  Patient presents with  . Abdominal Pain     (Consider location/radiation/quality/duration/timing/severity/associated sxs/prior Treatment) Patient is a 50 y.o. male presenting with general illness. The history is provided by the patient.  Illness Location:  RLQ pain Severity:  Moderate Onset quality:  Gradual Duration:  1 day Timing:  Constant Progression:  Worsening Chronicity:  New Context:  No pertinent past medical history. Onset of right lower quadrant since yesterday morning. Signed his PCP. CAT scan of his abdomen and pelvis done outpatient. Evidence of acute appendicitis. Sent here. Endorses subjective fevers and chills. Endorses anorexia. No nausea vomiting or diarrhea. Associated symptoms: abdominal pain and fever   Associated symptoms: no chest pain, no cough, no diarrhea, no headaches, no nausea, no shortness of breath and no vomiting     Past Medical History  Diagnosis Date  . Persistent atrial fibrillation (HCC)   . IBS (irritable bowel syndrome)   . Mild tricuspid regurgitation   . IBS (irritable bowel syndrome)    Past Surgical History  Procedure Laterality Date  . Cardioversion    . Cardiac catheterization  01/21/11  . Tee without cardioversion  07/07/2012    Procedure: TRANSESOPHAGEAL ECHOCARDIOGRAM (TEE);  Surgeon: Dolores Patty, MD;  Location: [facility] ENDOSCOPY;  Service: Cardiovascular;  Laterality: N/A;  . Atrial fibrillation ablation  07/07/12    AFib ablation by Dr Johney Frame  . Atrial fibrillation ablation N/A 07/07/2012    Procedure: ATRIAL FIBRILLATION ABLATION;  Surgeon: Hillis Range, MD;  Location: [facility] CATH LAB;  Service: Cardiovascular;  Laterality: N/A;   Family History  Problem Relation Age of Onset  . Breast cancer Mother   . Lung cancer Mother   . Colon cancer Father    Social History  Substance Use Topics  .  Smoking status: Never Smoker   . Smokeless tobacco: Never Used  . Alcohol Use: 0.6 oz/week    1 Cans of beer per week     Comment: one per week    Review of Systems  Constitutional: Positive for fever and chills.  Respiratory: Negative for cough and shortness of breath.   Cardiovascular: Negative for chest pain.  Gastrointestinal: Positive for abdominal pain and constipation. Negative for nausea, vomiting, diarrhea and blood in stool.  Genitourinary: Negative for dysuria, penile pain and testicular pain.  Musculoskeletal: Negative for back pain.  Neurological: Negative for light-headedness and headaches.  All other systems reviewed and are negative.     Allergies  Review of patient's allergies indicates no known allergies.  Home Medications   Prior to Admission medications   Not on File   BP 102/71 mmHg  Pulse 61  Temp(Src) 98.5 F (36.9 C) (Oral)  Resp 18  Ht  (1.778 m)  Wt 88.451 kg  BMI 27.98 kg/m2  SpO2 100% Physical Exam  Constitutional: He is oriented to person, place, and time. He appears well-developed and well-nourished. No distress.  HENT:  Head: Normocephalic and atraumatic.  Eyes: EOM are normal. Pupils are equal, round, and reactive to light.  Cardiovascular: Normal rate, regular rhythm and intact distal pulses.   Pulmonary/Chest: Effort normal. No respiratory distress.  Abdominal: Soft. There is tenderness in the right lower quadrant. There is rebound, guarding and tenderness at McBurney's point.  Positive Rovsing, Psoas. Rebound present.  Musculoskeletal: He exhibits no edema.  Neurological: He is  alert and oriented to person, place, and time.  Skin: Skin is warm and dry.    ED Course  Procedures (including critical care time) Labs Review Labs Reviewed  COMPREHENSIVE METABOLIC PANEL  CBC  URINALYSIS, ROUTINE W REFLEX MICROSCOPIC ([facility])    Imaging Review Ct Abdomen Pelvis W Contrast  11/08/2015  CLINICAL DATA:  One-day history  of right lower quadrant pain. EXAM: CT ABDOMEN AND PELVIS WITH CONTRAST TECHNIQUE: Multidetector CT imaging of the abdomen and pelvis was performed using the standard protocol following bolus administration of intravenous contrast. CONTRAST:  100mL ISOVUE-300 IOPAMIDOL (ISOVUE-300) INJECTION 61% COMPARISON:  12/18/2007. FINDINGS: Lower chest: Visualized lungs are unremarkable. Nonacute fracture of the anterior right third rib is noted. Hepatobiliary: Tiny hypodensities in the left liver are stable and likely represent tiny cysts. There is no evidence for gallstones, gallbladder wall thickening, or pericholecystic fluid. No intrahepatic or extrahepatic biliary dilation. Pancreas: No focal mass lesion. No dilatation of the main duct. No intraparenchymal cyst. No peripancreatic edema. Spleen: Calcified granulomata. Adrenals/Urinary Tract: No adrenal nodule or mass. Tiny bilateral renal cysts. No enhancing kidney lesion. No hydronephrosis. No evidence for hydroureter. The urinary bladder appears normal for the degree of distention. Stomach/Bowel: Stomach is nondistended. No gastric wall thickening. No evidence of outlet obstruction. Duodenum diverticulum noted. No small bowel wall thickening. No small bowel dilatation. The terminal ileum is normal. The appendix measures up to 12 mm in diameter. There is a trace amount of periappendiceal fluid and subtle, but circumferential periappendiceal edema/ inflammation is evident (See images 65-71 of series 2). Wall thickening in the appendix is well demonstrated on coronal image 37 of series 3. No gross colonic mass. No colonic wall thickening. No substantial diverticular change. Vascular/Lymphatic: No abdominal aortic aneurysm. No abdominal aortic atherosclerotic calcification. There is no gastrohepatic or hepatoduodenal ligament lymphadenopathy. No intraperitoneal or retroperitoneal lymphadenopathy. No pelvic sidewall lymphadenopathy. Reproductive: The prostate gland and  seminal vesicles have normal imaging features. Other: No intraperitoneal free fluid. Musculoskeletal: Bone windows reveal no worrisome lytic or sclerotic osseous lesions. IMPRESSION: 1. Dilated, hyperemic appendix, consistent with acute appendicitis. There is trace periappendiceal fluid with associated periappendiceal edema/inflammation. No evidence for abscess at this time. No substantial intraperitoneal free fluid. 2. Nonacute fracture involving the anterior right third rib. Our technologist, Sheilah MinsMarty Surratt, is calling this report to Dr. Jennye BoroughsMedoff's office before the patient leaves the imaging center. Electronically Signed   By: Kennith CenterEric  Mansell M.D.   On: 11/08/2015 15:35   I have personally reviewed and evaluated these images and lab results as part of my medical decision-making.   EKG Interpretation None      MDM   Final diagnoses:  Acute appendicitis with localized peritonitis    Patient with no pertinent past medical history presenting with acute appendicitis. CAT scan done as an outpatient. CT proven appendicitis. Patient has exam consistent for appendicitis. Starting the patient on IV fluids and pain medications. He has been nothing by mouth since yesterday morning. Starting the patient on antibiotics. I have paged the general surgeons. Pending their recommendations.  17:15 Per general surgery - they will eval the patient in the ED.   19:28 Surgery evaluated the patient in the emergency department. They will bring the patient directly to the operating room.  19:53 Patient admitted in stable condition.  Lindalou HoseSean O'Rourke, MD 11/08/15 Rushie Goltz1952  Dione Boozeavid Glick, MD 11/08/15 [phone]

## 2015-11-08 NOTE — H&P (Signed)
Chad Braun is an 50 y.o. male.   Chief Complaint: Abdominal pain and diagnosis of appendicitis HPI: Pain since yesterday morning, worked both days, pain worsened, CT done by GI doc (pt has a hx of IBS), showed acute appendicitis without perforation.  Past Medical History  Diagnosis Date  . Persistent atrial fibrillation ([place])   . IBS (irritable bowel syndrome)   . Mild tricuspid regurgitation   . IBS (irritable bowel syndrome)     Past Surgical History  Procedure Laterality Date  . Cardioversion    . Cardiac catheterization  01/21/11  . Tee without cardioversion  07/07/2012    Procedure: TRANSESOPHAGEAL ECHOCARDIOGRAM (TEE);  Surgeon: Jolaine Artist, MD;  Location: [facility] ENDOSCOPY;  Service: Cardiovascular;  Laterality: N/A;  . Atrial fibrillation ablation  07/07/12    AFib ablation by Dr Rayann Heman  . Atrial fibrillation ablation N/A 07/07/2012    Procedure: ATRIAL FIBRILLATION ABLATION;  Surgeon: Thompson Grayer, MD;  Location: [facility] CATH LAB;  Service: Cardiovascular;  Laterality: N/A;    Family History  Problem Relation Age of Onset  . Breast cancer Mother   . Lung cancer Mother   . Colon cancer Father    Social History:  reports that he has never smoked. He has never used smokeless tobacco. He reports that he drinks about 0.6 oz of alcohol per week. He reports that he does not use illicit drugs.  Allergies: No Known Allergies   (Not in a hospital admission)  Results for orders placed or performed during the hospital encounter of 11/08/15 (from the past 48 hour(s))  Comprehensive metabolic panel     Status: None   Collection Time: 11/08/15  4:55 PM  Result Value Ref Range   Sodium 137 135 - 145 mmol/L   Potassium 3.9 3.5 - 5.1 mmol/L   Chloride 102 101 - 111 mmol/L   CO2 26 22 - 32 mmol/L   Glucose, Bld 93 65 - 99 mg/dL   BUN 9 6 - 20 mg/dL   Creatinine, Ser 0.80 0.61 - 1.24 mg/dL   Calcium 9.1 8.9 - 10.3 mg/dL   Total Protein 6.6 6.5 - 8.1 g/dL   Albumin 3.7 3.5 - 5.0  g/dL   AST 23 15 - 41 U/L   ALT 21 17 - 63 U/L   Alkaline Phosphatase 114 38 - 126 U/L   Total Bilirubin 1.2 0.3 - 1.2 mg/dL   GFR calc non Af Amer >60 >60 mL/min   GFR calc Af Amer >60 >60 mL/min    Comment: (NOTE) The eGFR has been calculated using the CKD EPI equation. This calculation has not been validated in all clinical situations. eGFR's persistently <60 mL/min signify possible Chronic Kidney Disease.    Anion gap 9 5 - 15  CBC     Status: None   Collection Time: 11/08/15  4:55 PM  Result Value Ref Range   WBC 9.0 4.0 - 10.5 K/uL   RBC 4.61 4.22 - 5.81 MIL/uL   Hemoglobin 14.0 13.0 - 17.0 g/dL   HCT 41.6 39.0 - 52.0 %   MCV 90.2 78.0 - 100.0 fL   MCH 30.4 26.0 - 34.0 pg   MCHC 33.7 30.0 - 36.0 g/dL   RDW 12.8 11.5 - 15.5 %   Platelets 189 150 - 400 K/uL   Ct Abdomen Pelvis W Contrast  11/08/2015  CLINICAL DATA:  One-day history of right lower quadrant pain. EXAM: CT ABDOMEN AND PELVIS WITH CONTRAST TECHNIQUE: Multidetector CT imaging of the  abdomen and pelvis was performed using the standard protocol following bolus administration of intravenous contrast. CONTRAST:  125m ISOVUE-300 IOPAMIDOL (ISOVUE-300) INJECTION 61% COMPARISON:  12/18/2007. FINDINGS: Lower chest: Visualized lungs are unremarkable. Nonacute fracture of the anterior right third rib is noted. Hepatobiliary: Tiny hypodensities in the left liver are stable and likely represent tiny cysts. There is no evidence for gallstones, gallbladder wall thickening, or pericholecystic fluid. No intrahepatic or extrahepatic biliary dilation. Pancreas: No focal mass lesion. No dilatation of the main duct. No intraparenchymal cyst. No peripancreatic edema. Spleen: Calcified granulomata. Adrenals/Urinary Tract: No adrenal nodule or mass. Tiny bilateral renal cysts. No enhancing kidney lesion. No hydronephrosis. No evidence for hydroureter. The urinary bladder appears normal for the degree of distention. Stomach/Bowel: Stomach is  nondistended. No gastric wall thickening. No evidence of outlet obstruction. Duodenum diverticulum noted. No small bowel wall thickening. No small bowel dilatation. The terminal ileum is normal. The appendix measures up to 12 mm in diameter. There is a trace amount of periappendiceal fluid and subtle, but circumferential periappendiceal edema/ inflammation is evident (See images 65-71 of series 2). Wall thickening in the appendix is well demonstrated on coronal image 37 of series 3. No gross colonic mass. No colonic wall thickening. No substantial diverticular change. Vascular/Lymphatic: No abdominal aortic aneurysm. No abdominal aortic atherosclerotic calcification. There is no gastrohepatic or hepatoduodenal ligament lymphadenopathy. No intraperitoneal or retroperitoneal lymphadenopathy. No pelvic sidewall lymphadenopathy. Reproductive: The prostate gland and seminal vesicles have normal imaging features. Other: No intraperitoneal free fluid. Musculoskeletal: Bone windows reveal no worrisome lytic or sclerotic osseous lesions. IMPRESSION: 1. Dilated, hyperemic appendix, consistent with acute appendicitis. There is trace periappendiceal fluid with associated periappendiceal edema/inflammation. No evidence for abscess at this time. No substantial intraperitoneal free fluid. 2. Nonacute fracture involving the anterior right third rib. Our technologist, MCoralee Pesa is calling this report to Dr. MLiliane Channeloffice before the patient leaves the imaging center. Electronically Signed   By: EMisty StanleyM.D.   On: 11/08/2015 15:35    Review of Systems  Constitutional: Negative.   HENT: Negative.   Gastrointestinal: Positive for abdominal pain. Negative for nausea and vomiting.  Skin: Negative.   All other systems reviewed and are negative.   Blood pressure 102/71, pulse 61, temperature 98.5 F (36.9 C), temperature source Oral, resp. rate 18, height _0  (1.778 m), weight 88.451 kg (195 lb), SpO2 100  %. Physical Exam  Vitals reviewed. Constitutional: He is oriented to person, place, and time. He appears well-developed and well-nourished.  HENT:  Head: Normocephalic and atraumatic.  Eyes: Conjunctivae are normal. Pupils are equal, round, and reactive to light.  Neck: Normal range of motion. Neck supple.  Cardiovascular: Normal rate, regular rhythm and normal heart sounds.   Respiratory: Effort normal and breath sounds normal.  GI: Soft. Bowel sounds are normal. There is tenderness. There is rebound, guarding and tenderness at McBurney's point.  Positive Rovsing's sign  Musculoskeletal: Normal range of motion.  Neurological: He is alert and oriented to person, place, and time.  Skin: Skin is warm and dry.  Psychiatric: He has a normal mood and affect. His behavior is normal. Judgment and thought content normal.     Assessment/Plan Acute appendicitis withou apparent rupture  Plan for laparoscopic appendectomy, possible open.  Risks and benefits explained to the patient and he and his wife understand and wish to proceed.  JJudeth Horn MD 11/08/2015, 7:20 PM

## 2015-11-08 NOTE — Anesthesia Procedure Notes (Signed)
Procedure Name: Intubation Date/Time: 11/08/2015 8:27 PM Performed by: Bobbie StackANDERSON, Kevia Zaucha KIRSTEN Pre-anesthesia Checklist: Patient identified, Timeout performed, Emergency Drugs available, Suction available and Patient being monitored Patient Re-evaluated:Patient Re-evaluated prior to inductionOxygen Delivery Method: Circle system utilized Preoxygenation: Pre-oxygenation with 100% oxygen Intubation Type: IV induction and Rapid sequence Laryngoscope Size: Miller and 2 Grade View: Grade II Tube type: Oral Tube size: 7.5 mm Number of attempts: 1 Airway Equipment and Method: Stylet Placement Confirmation: ETT inserted through vocal cords under direct vision,  breath sounds checked- equal and bilateral and positive ETCO2 Secured at: 24 cm Tube secured with: Tape Dental Injury: Teeth and Oropharynx as per pre-operative assessment

## 2015-11-08 NOTE — Op Note (Signed)
OPERATIVE REPORT  DATE OF OPERATION: 11/08/2015  PATIENT:  Chad Braun  50 y.o. male  PRE-OPERATIVE DIAGNOSIS:  Acute appendicitis  POST-OPERATIVE DIAGNOSIS:  Acute appendicitis  FINDINGS:  Acutely inflamed appendix without obvious perforation.  PROCEDURE:  Procedure(s): APPENDECTOMY LAPAROSCOPIC  SURGEON:  Surgeon(s): Jimmye NormanJames Chyna Kneece, MD  ASSISTANT: None  ANESTHESIA:   general  COMPLICATIONS:  None  EBL: 20 ml  BLOOD ADMINISTERED: none  DRAINS: none   SPECIMEN:  Source of Specimen:  Appendix  COUNTS CORRECT:  YES  PROCEDURE DETAILS: The patient was taken to the operating room and placed on the table in the supine position. After an adequate general endotracheal anesthetic was administered, he was prepped and draped in usual sterile manner exposing his abdomen.  A proper timeout was performed identifying the patient and the procedure to be performed. A supraumbilical midline incision was made using a #15 blade and taken down to the midline fascia which was subsequently incised. We bluntly dissected down into the perineal cavity and then passed a pursestring suture of 0 Vicryl around the fascial opening. A Hassan cannula was passed into the peritoneal cavity and secured in place with the pursestring suture, and was through through this cannula that carbon dioxide gas was insufflated up to a maximal intra-abdominal pressure of 15 mmHg.  A right upper quadrant 5 mm cannula and a left low quadrant 5 mm cannula were passed under direct vision. The patient was placed in Trendelenburg and the left side was tilted down. The appendix was covered by the terminal ileum and local inflammatory structures. We were able to dissected free and also rotate the cecum medially by taken down laterally adhesions. Using a Harmonic Scalpel to take down the mesoappendix down to the base of the cecum. Was at that point and a blue cartridge Endo GIA was passed across the base of the appendix.  We  retrieved the appendix using an Endo Catch bag and subsequently inspected the area for bleeding. Nose minimal bleeding. We irrigated with less than a liter saline solution. We removed the supraumbilical cannula under direct vision and tied off the fascia using the pursestring suture which was in place.  We aspirated all fluid and gas in detached the upper scalp equipment.  All cannulas were removed. We injected 0.25% Marcaine with epinephrine at all sites. The supraumbilical skin site was closed using a running subcuticular stitch of 4-0 Monocryl. Dermabond, Steri-Strips, and Tegaderm used to complete our dressings. All needle counts, sponge counts, and instrument counts were correct.  PATIENT DISPOSITION:  PACU - hemodynamically stable.   Chad Braun 5/10/20179:36 PM

## 2015-11-08 NOTE — ED Notes (Signed)
Pt here for RLQ pain since yesterday in morning. Sent here by PCP for acute appendicitis. CT scan done this am.

## 2015-11-08 NOTE — Anesthesia Postprocedure Evaluation (Signed)
Anesthesia Post Note  Patient: Chad Braun  Procedure(s) Performed: Procedure(s) (LRB): APPENDECTOMY LAPAROSCOPIC (N/A)  Patient location during evaluation: PACU Anesthesia Type: General Level of consciousness: awake Pain management: pain level controlled Vital Signs Assessment: post-procedure vital signs reviewed and stable Respiratory status: spontaneous breathing Cardiovascular status: stable Anesthetic complications: no    Last Vitals:  Filed Vitals:   11/08/15 1644 11/08/15 2135  BP: 102/71   Pulse: 61 83  Temp: 36.9 C 36.8 C  Resp: 18 16    Last Pain:  Filed Vitals:   11/08/15 2145  PainSc: 0-No pain                 EDWARDS,Jodye Scali

## 2015-11-08 NOTE — Transfer of Care (Signed)
Immediate Anesthesia Transfer of Care Note  Patient: Chad Braun  Procedure(s) Performed: Procedure(s): APPENDECTOMY LAPAROSCOPIC (N/A)  Patient Location: PACU  Anesthesia Type:General  Level of Consciousness: awake, alert , oriented and patient cooperative  Airway & Oxygen Therapy: Patient Spontanous Breathing and Patient connected to nasal cannula oxygen  Post-op Assessment: Report given to RN, Post -op Vital signs reviewed and stable and Patient moving all extremities X 4  Post vital signs: Reviewed and stable  Last Vitals:  Filed Vitals:   11/08/15 1644  BP: 102/71  Pulse: 61  Temp: 36.9 C  Resp: 18    Last Pain:  Filed Vitals:   11/08/15 1843  PainSc: 7          Complications: No apparent anesthesia complications

## 2015-11-08 NOTE — Progress Notes (Signed)
Marline BackboneSheldon C Bamba is a 50 y.o. male patient admitted from PACU awake, alert - oriented  X 4 - no acute distress noted.  VSS - Blood pressure 118/76, pulse 79, temperature 98.9 F (37.2 C), temperature source Oral, resp. rate 16, height 5\' 10"  (1.778 m), weight 90 kg (198 lb 6.6 oz), SpO2 94 %.    IV in place, occlusive dsg intact without redness.  Orientation to room.  Admission INP armband ID verified with patient/family, and in place.   SR up x 2, fall assessment complete, with patient and family able to verbalize understanding of risk associated with falls, and verbalized understanding to call nsg before up out of bed.  Call light within reach, patient able to voice, and demonstrate understanding.    Will cont to eval and treat per MD orders.  Rolland PorterJosephine M Sadeel Fiddler, RN 11/08/2015 11:20 PM

## 2015-11-08 NOTE — Anesthesia Preprocedure Evaluation (Signed)
Anesthesia Evaluation  Patient identified by MRN, date of birth, ID band Patient awake    Reviewed: Allergy & Precautions, NPO status , Patient's Chart, lab work & pertinent test results  Airway Mallampati: II  TM Distance: >3 FB Neck ROM: Full    Dental   Pulmonary neg pulmonary ROS,    breath sounds clear to auscultation       Cardiovascular negative cardio ROS   Rhythm:Regular Rate:Normal     Neuro/Psych    GI/Hepatic Neg liver ROS, GI history noted. CE   Endo/Other  negative endocrine ROS  Renal/GU negative Renal ROS     Musculoskeletal   Abdominal   Peds  Hematology   Anesthesia Other Findings   Reproductive/Obstetrics                             Anesthesia Physical Anesthesia Plan  ASA: I and emergent  Anesthesia Plan: General   Post-op Pain Management:    Induction: Intravenous, Rapid sequence and Cricoid pressure planned  Airway Management Planned: Oral ETT  Additional Equipment:   Intra-op Plan:   Post-operative Plan: Extubation in OR  Informed Consent: I have reviewed the patients History and Physical, chart, labs and discussed the procedure including the risks, benefits and alternatives for the proposed anesthesia with the patient or authorized representative who has indicated his/her understanding and acceptance.   Dental advisory given  Plan Discussed with: CRNA and Anesthesiologist  Anesthesia Plan Comments:         Anesthesia Quick Evaluation

## 2015-11-09 MED ORDER — OXYCODONE-ACETAMINOPHEN 5-325 MG PO TABS: 1.0000 | ORAL_TABLET | ORAL | Status: DC | PRN

## 2015-11-09 NOTE — Discharge Instructions (Signed)

## 2015-11-09 NOTE — Progress Notes (Signed)
11/09/15  1000  Reviewed discharge instructions with patient. Patient verbalized understanding of discharge instructions. Copy of discharge instructions and prescription given to patient.

## 2015-11-09 NOTE — Discharge Summary (Signed)
Physician Discharge Summary  Patient ID: Chad Braun MRN: [id number] DOB/AGE: [birth year] 50 y.o.  Admit date: 11/08/2015 Discharge date: 11/09/2015  Admitting Diagnosis: Acute appendicitis   Discharge Diagnosis Patient Active Problem List   Diagnosis Date Noted  . Acute appendicitis 11/08/2015  . Paresthesia and pain of left extremity 08/02/2015  . Atrial fibrillation (HCC) 02/19/2011  . Asymptomatic LV dysfunction 02/19/2011  . Chronic anticoagulation 02/19/2011    Consultants none  Imaging: Ct Abdomen Pelvis W Contrast  11/08/2015  CLINICAL DATA:  One-day history of right lower quadrant pain. EXAM: CT ABDOMEN AND PELVIS WITH CONTRAST TECHNIQUE: Multidetector CT imaging of the abdomen and pelvis was performed using the standard protocol following bolus administration of intravenous contrast. CONTRAST:  ISOVUE-300 IOPAMIDOL (ISOVUE-300) INJECTION 61% COMPARISON:  12/18/2007. FINDINGS: Lower chest: Visualized lungs are unremarkable. Nonacute fracture of the anterior right third rib is noted. Hepatobiliary: Tiny hypodensities in the left liver are stable and likely represent tiny cysts. There is no evidence for gallstones, gallbladder wall thickening, or pericholecystic fluid. No intrahepatic or extrahepatic biliary dilation. Pancreas: No focal mass lesion. No dilatation of the main duct. No intraparenchymal cyst. No peripancreatic edema. Spleen: Calcified granulomata. Adrenals/Urinary Tract: No adrenal nodule or mass. Tiny bilateral renal cysts. No enhancing kidney lesion. No hydronephrosis. No evidence for hydroureter. The urinary bladder appears normal for the degree of distention. Stomach/Bowel: Stomach is nondistended. No gastric wall thickening. No evidence of outlet obstruction. Duodenum diverticulum noted. No small bowel wall thickening. No small bowel dilatation. The terminal ileum is normal. The appendix measures up to 12 mm in diameter. There is a trace amount of  periappendiceal fluid and subtle, but circumferential periappendiceal edema/ inflammation is evident (See images 65-71 of series 2). Wall thickening in the appendix is well demonstrated on coronal image 37 of series 3. No gross colonic mass. No colonic wall thickening. No substantial diverticular change. Vascular/Lymphatic: No abdominal aortic aneurysm. No abdominal aortic atherosclerotic calcification. There is no gastrohepatic or hepatoduodenal ligament lymphadenopathy. No intraperitoneal or retroperitoneal lymphadenopathy. No pelvic sidewall lymphadenopathy. Reproductive: The prostate gland and seminal vesicles have normal imaging features. Other: No intraperitoneal free fluid. Musculoskeletal: Bone windows reveal no worrisome lytic or sclerotic osseous lesions. IMPRESSION: 1. Dilated, hyperemic appendix, consistent with acute appendicitis. There is trace periappendiceal fluid with associated periappendiceal edema/inflammation. No evidence for abscess at this time. No substantial intraperitoneal free fluid. 2. Nonacute fracture involving the anterior right third rib. Our technologist, Sheilah Mins, is calling this report to Dr. Jennye Boroughs office before the patient leaves the imaging center. Electronically Signed   By: Kennith Center M.D.   On: 11/08/2015 15:35    Procedures Laparoscopic appendectomy---Dr. Rexene Edison Course:  Chad Braun is a 50 year old male with a history of IBS who was seen by his GI doctors for abdominal pain, CT showed acute appendicitis and he was therefore referred to the Ed.  Patient was admitted and underwent procedure listed above.  Tolerated procedure well and was transferred to the floor.  Diet was advanced as tolerated.  On POD#1, the patient was voiding well, tolerating diet, ambulating well, pain well controlled, vital signs stable, incisions c/d/i and felt stable for discharge home.  Medication risks, benefits and therapeutic alternatives were reviewed with the  patient.  He verbalizes understanding.  Patient will follow up in our office in 3 weeks and knows to call with questions or concerns.  Physical Exam: General:  Alert, NAD, pleasant, comfortable Abd:  Soft, ND, mild tenderness, incisions  C/D/I     Medication List    TAKE these medications        bismuth subsalicylate 262 MG/15ML suspension  Commonly known as:  PEPTO BISMOL  Take 30 mLs by mouth every 6 (six) hours as needed for indigestion.     naproxen sodium 220 MG tablet  Commonly known as:  ANAPROX  Take 220 mg by mouth 2 (two) times daily as needed (for pain).     oxyCODONE-acetaminophen 5-325 MG tablet  Commonly known as:  ROXICET  Take 1-2 tablets by mouth every 4 (four) hours as needed for severe pain.             Follow-up Information    Follow up with [facility] On 11/29/2015.   Specialty:  General Surgery   Why:  arrive by 9:30AM for a 10AM post op check   Contact information:   [address] [place] [address] [phone]       Signed: Ashok Norrismina Kissie Ziolkowski, [facility]NP-BC [facility] [phone]  11/09/2015, 9:30 AM

## 2015-11-10 ENCOUNTER — Encounter (HOSPITAL_COMMUNITY): Payer: Self-pay | Admitting: General Surgery

## 2015-11-15 ENCOUNTER — Ambulatory Visit: Payer: Commercial Managed Care - HMO | Admitting: Family Medicine

## 2016-07-31 ENCOUNTER — Encounter: Payer: Self-pay | Admitting: *Deleted

## 2016-08-01 ENCOUNTER — Ambulatory Visit: Payer: Commercial Managed Care - HMO | Admitting: Internal Medicine

## 2016-08-05 ENCOUNTER — Encounter: Payer: Self-pay | Admitting: *Deleted

## 2016-08-05 ENCOUNTER — Ambulatory Visit (INDEPENDENT_AMBULATORY_CARE_PROVIDER_SITE_OTHER): Payer: Commercial Managed Care - HMO | Admitting: Internal Medicine

## 2016-08-05 ENCOUNTER — Encounter (INDEPENDENT_AMBULATORY_CARE_PROVIDER_SITE_OTHER): Payer: Self-pay

## 2016-08-05 ENCOUNTER — Encounter: Payer: Self-pay | Admitting: Internal Medicine

## 2016-08-05 VITALS — BP 106/68 | HR 56 | Ht 70.0 in | Wt 193.4 lb

## 2016-08-05 DIAGNOSIS — I480 Paroxysmal atrial fibrillation: Secondary | ICD-10-CM

## 2016-08-05 NOTE — Patient Instructions (Signed)
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
## 2016-11-04 IMAGING — CR DG LUMBAR SPINE 2-3V
3 series · 3 of 3 positions shown · non-contrast
Comparison: None.

CLINICAL DATA: Low back pain radiating down left leg for 6 months.
No known injury.

EXAM:
LUMBAR SPINE - 2-3 VIEW

[w lumbar spine ap]
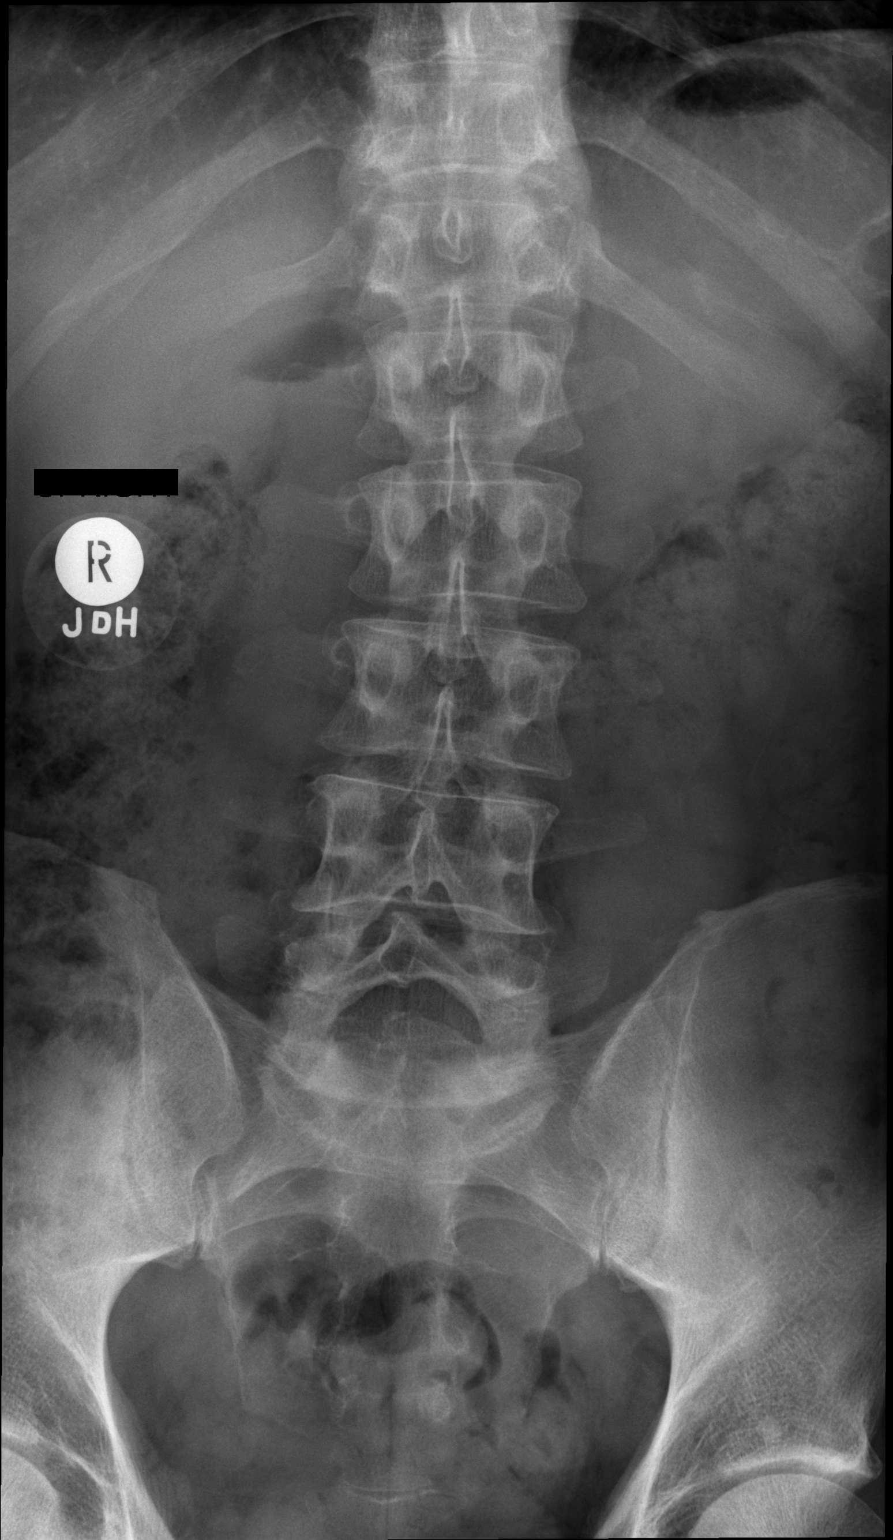

[w lumbar spine lat]
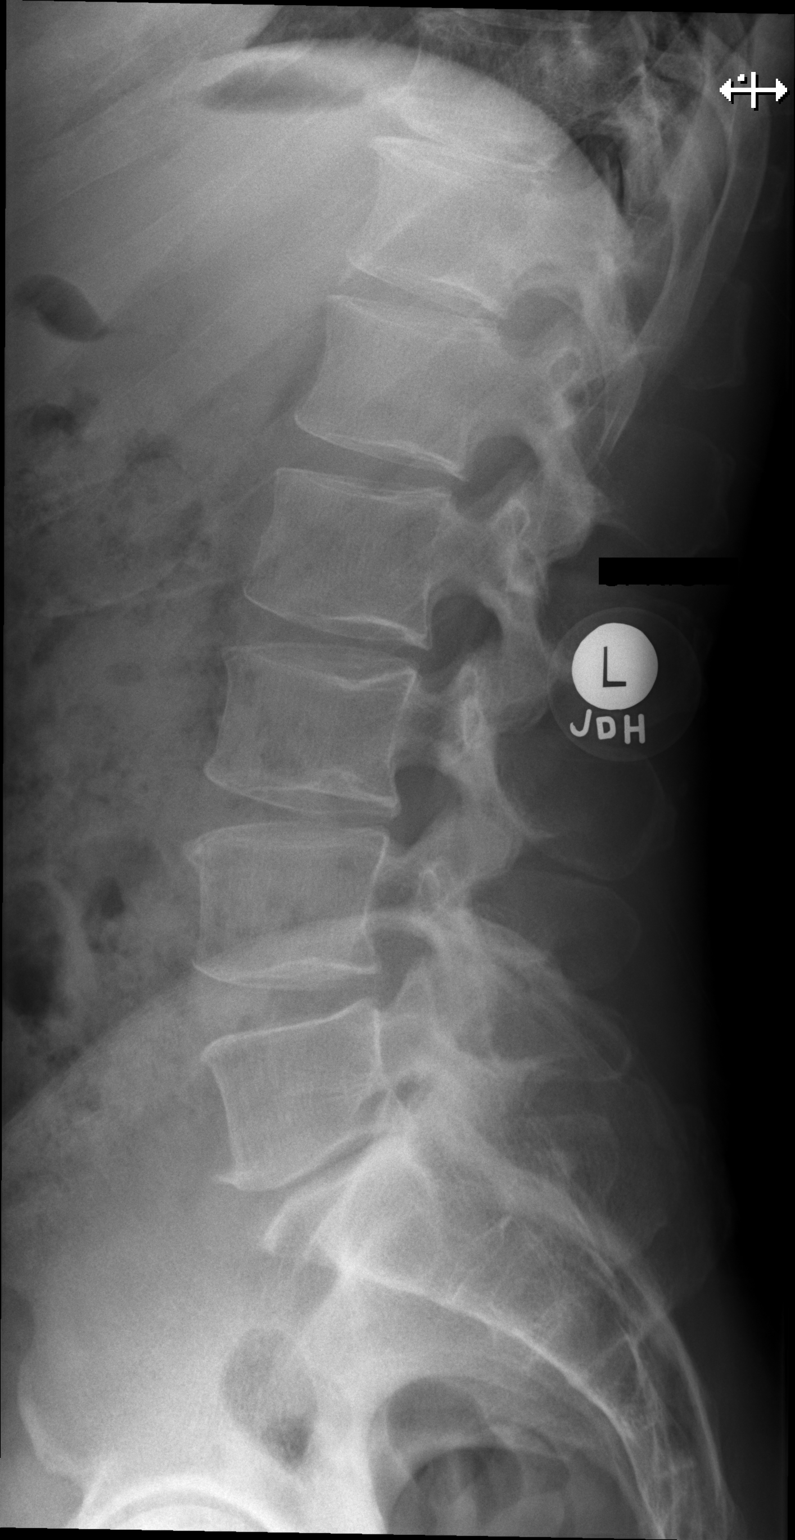

[w lumbar l-5 s-1 spot]
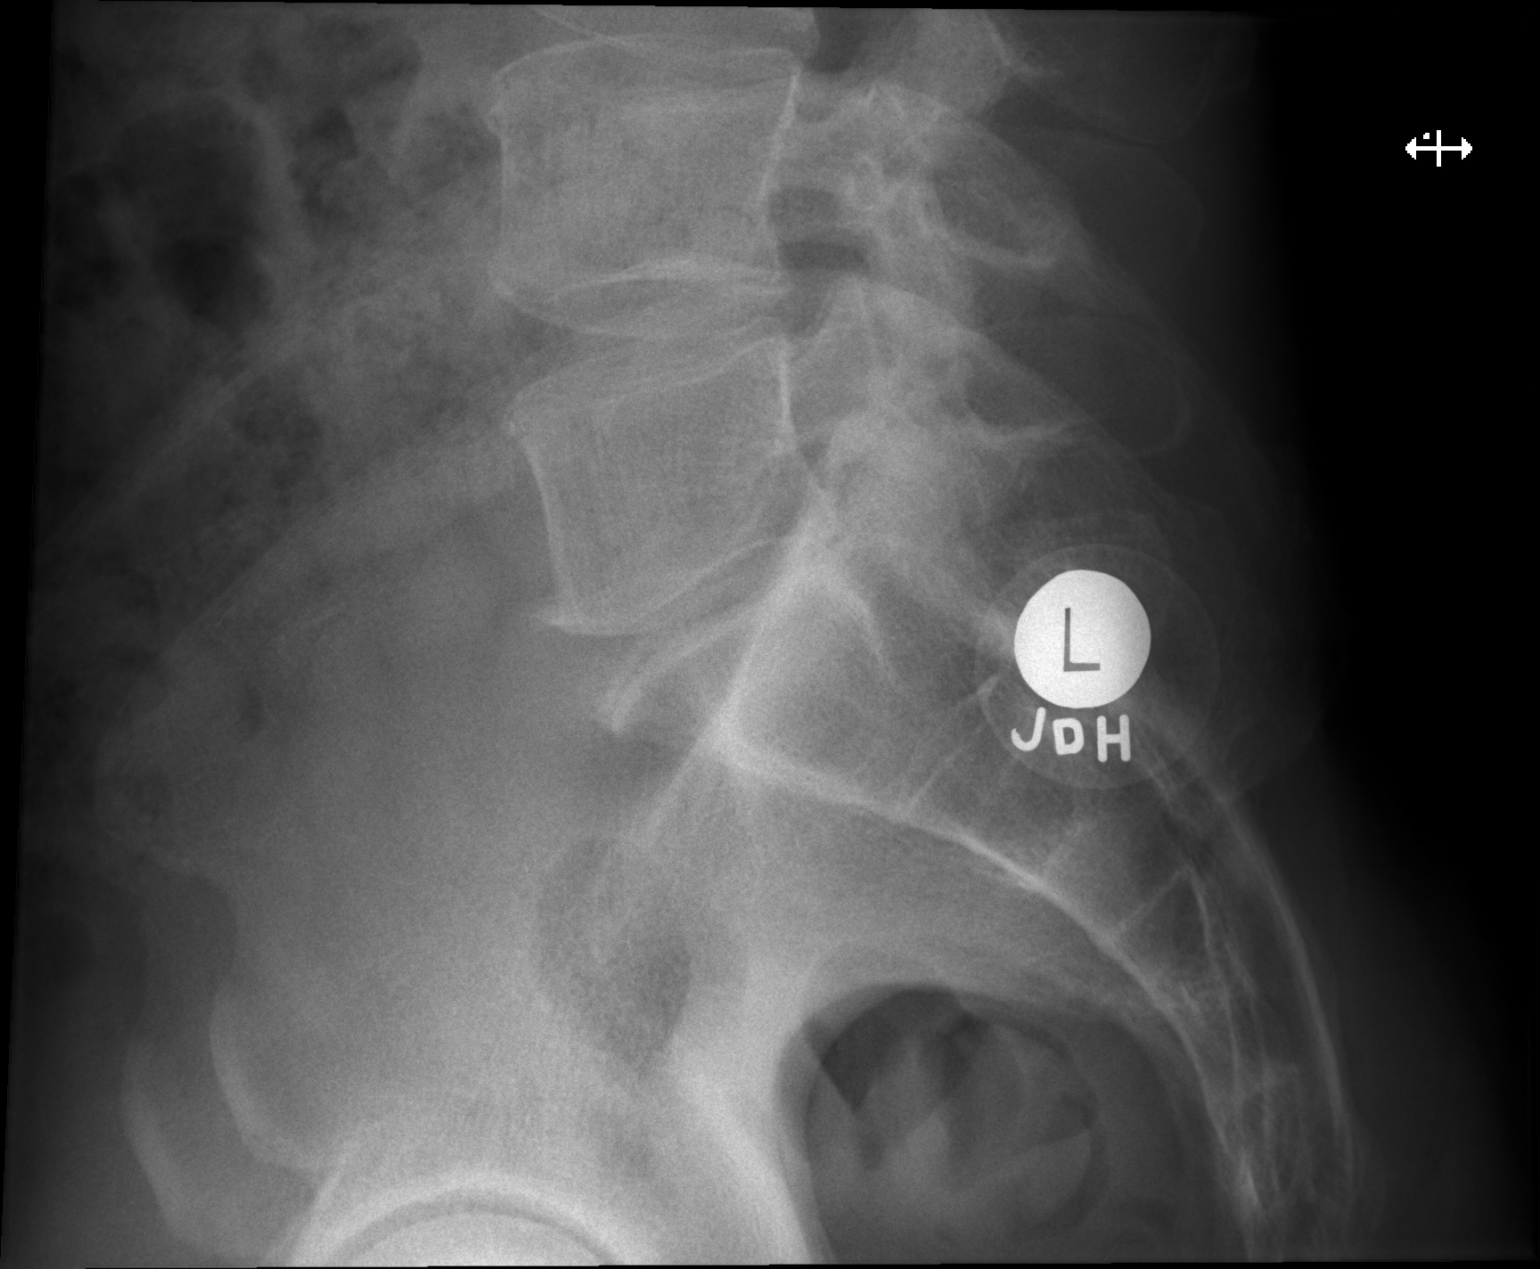

[3 of 3 positions shown; findings below may reference images not displayed]

FINDINGS: Degenerative disc disease at L5-S1 with disc space narrowing and
spurring. Normal alignment. No fracture. SI joints are symmetric and
unremarkable.
IMPRESSION: Degenerative disc disease at L5-S1.  No acute findings.

## 2017-08-13 ENCOUNTER — Encounter: Payer: Self-pay | Admitting: *Deleted

## 2017-08-13 ENCOUNTER — Ambulatory Visit (INDEPENDENT_AMBULATORY_CARE_PROVIDER_SITE_OTHER): Payer: 59 | Admitting: Internal Medicine

## 2017-08-13 ENCOUNTER — Encounter: Payer: Self-pay | Admitting: Internal Medicine

## 2017-08-13 VITALS — BP 114/70 | HR 69 | Ht 70.0 in | Wt 191.0 lb

## 2017-08-13 DIAGNOSIS — I480 Paroxysmal atrial fibrillation: Secondary | ICD-10-CM | POA: Diagnosis not present

## 2017-08-13 NOTE — Patient Instructions (Addendum)
Medication Instructions:  Your physician recommends that you continue on your current medications as directed. Please refer to the Current Medication list given to you today.  * If you need a refill on your cardiac medications before your next appointment, please call your pharmacy.   Labwork: None ordered  Testing/Procedures: None ordered  Follow-Up: Your physician wants you to follow-up in: 1 year with Dr. Allred.  You will receive a reminder letter in the mail two months in advance. If you don't receive a letter, please call our office to schedule the follow-up appointment.   Thank you for choosing [facility]!!      

## 2017-08-13 NOTE — Progress Notes (Signed)
   PCP: Patient, No Pcp Per   Primary EP: Dr Hassell HalimAllred  Chad Braun is a 52 y.o. male who presents today for routine electrophysiology followup.  Since last being seen in our clinic, the patient reports doing very well.  Today, he denies symptoms of palpitations, chest pain, shortness of breath,  lower extremity edema, dizziness, presyncope, or syncope.  The patient is otherwise without complaint today.   Past Medical History:  Diagnosis Date  . GERD (gastroesophageal reflux disease)   . IBS (irritable bowel syndrome)   . Mild tricuspid regurgitation   . Persistent atrial fibrillation [facility](HCC)    Past Surgical History:  Procedure Laterality Date  . ANKLE FRACTURE SURGERY Right 1998  . APPENDECTOMY  11/08/2015  . ATRIAL FIBRILLATION ABLATION N/A 07/07/2012   Procedure: ATRIAL FIBRILLATION ABLATION;  Surgeon: Hillis RangeJames Teaghan Melrose, MD;  [facility];  Service: Cardiovascular;  Laterality: N/A;  . CARDIAC CATHETERIZATION  01/21/2011  . CARDIOVERSION  05/2012   "unsuccessful"  . LAPAROSCOPIC APPENDECTOMY N/A 11/08/2015   Procedure: APPENDECTOMY LAPAROSCOPIC;  Surgeon: Jimmye NormanJames Wyatt, MD;  [facility];  Service: General;  Laterality: N/A;  . TEE WITHOUT CARDIOVERSION  07/07/2012   Procedure: TRANSESOPHAGEAL ECHOCARDIOGRAM (TEE);  Surgeon: Dolores Pattyaniel R Bensimhon, MD;  [facility];  Service: Cardiovascular;  Laterality: N/A;  . TIBIA FRACTURE SURGERY Right 1998    ROS- all systems are reviewed and negatives except as per HPI above  No current outpatient medications on file.   No current facility-administered medications for this visit.     Physical Exam: Vitals:   08/13/17 1538  BP: 114/70  Pulse: 69  Weight: 191 lb (86.6 kg)  Height: 5\' 10"  (1.778 m)    GEN- The patient is well appearing, alert and oriented x 3 today.   Head- normocephalic, atraumatic Eyes-  Sclera clear, conjunctiva pink Ears- hearing intact Oropharynx- clear Lungs- Clear to ausculation bilaterally,  normal work of breathing Heart- Regular rate and rhythm, no murmurs, rubs or gallops, PMI not laterally displaced GI- soft, NT, ND, + BS Extremities- no clubbing, cyanosis, or edema  EKG tracing ordered today is personally reviewed and shows sinus rhythm, early repolarization  Assessment and Plan:   1. Paroxysmal atrial fibrillation Doing well post ablation (2014) off AAD therapy chads2vasc score is 0.  No changes  Return in 1 year  Hillis RangeJames Alaze Garverick MD, [facility] 08/13/2017 4:08 PM

## 2018-03-08 ENCOUNTER — Emergency Department (HOSPITAL_COMMUNITY): Payer: 59

## 2018-03-08 ENCOUNTER — Encounter (HOSPITAL_COMMUNITY): Payer: Self-pay | Admitting: Emergency Medicine

## 2018-03-08 ENCOUNTER — Emergency Department (HOSPITAL_COMMUNITY)
Admission: EM | Admit: 2018-03-08 | Discharge: 2018-03-09 | Disposition: A | Discharge status: Home / Self Care | Payer: 59 | Attending: Emergency Medicine | Admitting: Emergency Medicine

## 2018-03-08 ENCOUNTER — Other Ambulatory Visit: Payer: Self-pay

## 2018-03-08 ENCOUNTER — Ambulatory Visit (HOSPITAL_COMMUNITY)
Admission: EM | Admit: 2018-03-08 | Discharge: 2018-03-08 | Disposition: A | Discharge status: Home / Self Care | Payer: 59 | Source: Home / Self Care

## 2018-03-08 DIAGNOSIS — Z23 Encounter for immunization: Secondary | ICD-10-CM | POA: Diagnosis not present

## 2018-03-08 DIAGNOSIS — S51012A Laceration without foreign body of left elbow, initial encounter: Secondary | ICD-10-CM

## 2018-03-08 DIAGNOSIS — I4891 Unspecified atrial fibrillation: Secondary | ICD-10-CM | POA: Insufficient documentation

## 2018-03-08 DIAGNOSIS — Y999 Unspecified external cause status: Secondary | ICD-10-CM | POA: Insufficient documentation

## 2018-03-08 DIAGNOSIS — Y929 Unspecified place or not applicable: Secondary | ICD-10-CM | POA: Insufficient documentation

## 2018-03-08 DIAGNOSIS — R0789 Other chest pain: Secondary | ICD-10-CM | POA: Diagnosis present

## 2018-03-08 DIAGNOSIS — M25522 Pain in left elbow: Secondary | ICD-10-CM | POA: Diagnosis not present

## 2018-03-08 DIAGNOSIS — Y9389 Activity, other specified: Secondary | ICD-10-CM | POA: Insufficient documentation

## 2018-03-08 LAB — CBC
HCT: 46.3 % (ref 39.0–52.0)
Hemoglobin: 15.6 g/dL (ref 13.0–17.0)
MCH: 31.3 pg (ref 26.0–34.0)
MCHC: 33.7 g/dL (ref 30.0–36.0)
MCV: 92.8 fL (ref 78.0–100.0)
PLATELETS: 198 10*3/uL (ref 150–400)
RBC: 4.99 MIL/uL (ref 4.22–5.81)
RDW: 12.1 % (ref 11.5–15.5)
WBC: 10.4 10*3/uL (ref 4.0–10.5)

## 2018-03-08 LAB — BASIC METABOLIC PANEL
Anion gap: 10 (ref 5–15)
BUN: 12 mg/dL (ref 6–20)
CALCIUM: 9.4 mg/dL (ref 8.9–10.3)
CHLORIDE: 102 mmol/L (ref 98–111)
CO2: 26 mmol/L (ref 22–32)
CREATININE: 0.89 mg/dL (ref 0.61–1.24)
GFR calc non Af Amer: >60 mL/min (ref >60)
Glucose, Bld: 124 mg/dL — ABNORMAL HIGH (ref 70–99)
Potassium: 3.6 mmol/L (ref 3.5–5.1)
SODIUM: 138 mmol/L (ref 135–145)

## 2018-03-08 LAB — I-STAT TROPONIN, ED: TROPONIN I, POC: 0.03 ng/mL (ref 0.00–0.08)

## 2018-03-08 MED ORDER — TETANUS-DIPHTH-ACELL PERTUSSIS 5-2.5-18.5 LF-MCG/0.5 IM SUSP
INTRAMUSCULAR | Status: AC
  Filled 2018-03-08: qty 0.5

## 2018-03-08 MED ORDER — LIDOCAINE-EPINEPHRINE (PF) 2 %-1:200000 IJ SOLN
INTRAMUSCULAR | Status: AC
  Filled 2018-03-08: qty 20

## 2018-03-08 MED ORDER — FENTANYL CITRATE (PF) 100 MCG/2ML IJ SOLN
100.0000 ug | Freq: Once | INTRAMUSCULAR | Status: DC
  Filled 2018-03-08: qty 2

## 2018-03-08 MED ORDER — TETANUS-DIPHTH-ACELL PERTUSSIS 5-2.5-18.5 LF-MCG/0.5 IM SUSP
0.5000 mL | Freq: Once | INTRAMUSCULAR | Status: AC
Start: 2018-03-08 — End: 2018-03-08
  Administered 2018-03-08: 0.5 mL via INTRAMUSCULAR

## 2018-03-08 MED ORDER — DILTIAZEM HCL-DEXTROSE 100-5 MG/100ML-% IV SOLN (PREMIX): 5.0000 mg/h | Freq: Once | INTRAVENOUS | Status: DC

## 2018-03-08 MED ORDER — CEPHALEXIN 500 MG PO CAPS: 500.0000 mg | ORAL_CAPSULE | Freq: Three times a day (TID) | ORAL | 0 refills | Status: DC

## 2018-03-08 MED ORDER — MIDAZOLAM HCL 2 MG/2ML IJ SOLN
4.0000 mg | Freq: Once | INTRAMUSCULAR | Status: DC
  Filled 2018-03-08: qty 4

## 2018-03-08 NOTE — Discharge Instructions (Addendum)
Change your dressing daily by soaking the wound in soapy water. Change the dressing daily. Come back if you develop redness, warmth, more swelling, pain. You may take 500mg  Tylenol with ibuprofen 400-600mg  every 6 hours for pain and inflammation.
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
## 2018-03-09 ENCOUNTER — Telehealth: Payer: Self-pay | Admitting: Internal Medicine

## 2018-03-09 MED ORDER — SODIUM CHLORIDE 0.9% FLUSH: 3.0000 mL | INTRAVENOUS | Status: DC | PRN

## 2018-03-09 MED ORDER — SODIUM CHLORIDE 0.9% FLUSH
3.0000 mL | Freq: Two times a day (BID) | INTRAVENOUS | Status: DC
  Administered 2018-03-09: 3 mL via INTRAVENOUS

## 2018-03-09 MED ORDER — METOPROLOL SUCCINATE ER 25 MG PO TB24: 25.0000 mg | ORAL_TABLET | Freq: Every day | ORAL | 0 refills | Status: DC

## 2018-03-09 MED ORDER — SODIUM CHLORIDE 0.9 % IV SOLN
250.0000 mL | INTRAVENOUS | Status: DC
  Administered 2018-03-09: 250 mL via INTRAVENOUS

## 2018-03-09 MED ORDER — PROPOFOL 10 MG/ML IV BOLUS
1.0000 mg/kg | Freq: Once | INTRAVENOUS | Status: DC
  Filled 2018-03-09: qty 20

## 2018-03-09 MED ORDER — PROPOFOL 10 MG/ML IV BOLUS
INTRAVENOUS | Status: AC | PRN
  Administered 2018-03-09: 90 mg via INTRAVENOUS
  Administered 2018-03-09: 45 mg via INTRAVENOUS

## 2018-03-09 MED ORDER — RIVAROXABAN 20 MG PO TABS: 20.0000 mg | ORAL_TABLET | Freq: Every day | ORAL | 0 refills | Status: DC

## 2018-03-09 NOTE — Telephone Encounter (Signed)
Call returned to Pt.  Advised to continue current medications per Dr. Johney Frame and f/u with Afib clinic this week.  Advised Pt would send message to afib clinic.  Pt thanked nurse for call.

## 2018-03-09 NOTE — ED Notes (Signed)
Patient verbalizes understanding of discharge instructions. Opportunity for questioning and answers were provided. Armband removed by staff, pt discharged from ED to home via POV  

## 2018-03-09 NOTE — Discharge Instructions (Addendum)
You were succesfully cardioverted today. You will start the following new medications until you are seen by Dr. Johney Frame:  Please call Dr. Jenel Lucks office very soon to schedule a follow-up appointment and discuss management.

## 2018-03-09 NOTE — Telephone Encounter (Signed)
Pt was in ER this weekend with Afib. Pt was Cardioverted and in now in NSR. Pt was put on Beta Blocker and A/C. He wants to make sure these are the meds he should be on. Pls advise
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
## 2018-03-09 NOTE — Sedation Documentation (Signed)
Cardioversion delivered by Dr. Elesa Massed successfully.

## 2018-03-10 ENCOUNTER — Telehealth (HOSPITAL_COMMUNITY): Payer: Self-pay | Admitting: *Deleted

## 2018-03-10 NOTE — Telephone Encounter (Signed)
LM on vcml to [facility]bk to sched appt

## 2018-03-11 ENCOUNTER — Ambulatory Visit (HOSPITAL_COMMUNITY)
Admission: EM | Admit: 2018-03-11 | Discharge: 2018-03-11 | Disposition: A | Discharge status: Home / Self Care | Payer: 59 | Attending: Family Medicine | Admitting: Family Medicine

## 2018-03-11 DIAGNOSIS — S51012D Laceration without foreign body of left elbow, subsequent encounter: Secondary | ICD-10-CM

## 2018-03-11 DIAGNOSIS — Z5189 Encounter for other specified aftercare: Secondary | ICD-10-CM

## 2018-03-11 NOTE — ED Triage Notes (Signed)
Pt was here for wound check.  Dahlia Byes, NP saw the pt and triaged him.

## 2018-03-11 NOTE — ED Provider Notes (Signed)
[facility]    CSN: [id number] Arrival date & time: 03/11/18  1543     History   Chief Complaint No chief complaint on file.   HPI Chad Braun is a 52 y.o. male.   Pt here for wound check. Was here Saturday and sutured.     Past Medical History:  Diagnosis Date  . GERD (gastroesophageal reflux disease)   . IBS (irritable bowel syndrome)   . Mild tricuspid regurgitation   . Persistent atrial fibrillation [facility])     Patient Active Problem List   Diagnosis Date Noted  . Acute appendicitis 11/08/2015  . Paresthesia and pain of left extremity 08/02/2015  . Atrial fibrillation (HCC) 02/19/2011  . Asymptomatic LV dysfunction 02/19/2011  . Chronic anticoagulation 02/19/2011    Past Surgical History:  Procedure Laterality Date  . ANKLE FRACTURE SURGERY Right 1998  . APPENDECTOMY  11/08/2015  . ATRIAL FIBRILLATION ABLATION N/A 07/07/2012   Procedure: ATRIAL FIBRILLATION ABLATION;  Surgeon: Hillis Range, MD;  Location: [facility] CATH LAB;  Service: Cardiovascular;  Laterality: N/A;  . CARDIAC CATHETERIZATION  01/21/2011  . CARDIOVERSION  05/2012   "unsuccessful"  . LAPAROSCOPIC APPENDECTOMY N/A 11/08/2015   Procedure: APPENDECTOMY LAPAROSCOPIC;  Surgeon: Jimmye Norman, MD;  Location: [facility] OR;  Service: General;  Laterality: N/A;  . TEE WITHOUT CARDIOVERSION  07/07/2012   Procedure: TRANSESOPHAGEAL ECHOCARDIOGRAM (TEE);  Surgeon: Dolores Patty, MD;  Location: [facility] ENDOSCOPY;  Service: Cardiovascular;  Laterality: N/A;  . TIBIA FRACTURE SURGERY Right 1998       Home Medications    Prior to Admission medications   Medication Sig Start Date End Date Taking? Authorizing Provider  cephALEXin (KEFLEX) 500 MG capsule Take 1 capsule (500 mg total) by mouth 3 (three) times daily. 03/08/18   Wallis Bamberg, PA-C  metoprolol succinate (TOPROL-XL) 25 MG 24 hr tablet Take 1 tablet (25 mg total) by mouth daily. 03/09/18   Khatri, Hina, PA-C  rivaroxaban (XARELTO) 20 MG TABS tablet  Take 1 tablet (20 mg total) by mouth daily with supper. 03/09/18   Dietrich Pates, PA-C    Family History Family History  Problem Relation Age of Onset  . Breast cancer Mother   . Lung cancer Mother   . Colon cancer Father     Social History Social History   Tobacco Use  . Smoking status: Never Smoker  . Smokeless tobacco: Never Used  Substance Use Topics  . Alcohol use: Yes    Alcohol/week: 2.0 standard drinks    Types: 1 Cans of beer, 1 Glasses of wine per week  . Drug use: No     Allergies   Patient has no known allergies.   Review of Systems Review of Systems   Physical Exam Triage Vital Signs ED Triage Vitals [03/11/18 1628]  Enc Vitals Group     BP (!) 141/77     Pulse Rate 63     Resp 16     Temp (!) 97.4 F (36.3 C)     Temp Source Oral     SpO2 99 %     Weight      Height      Head Circumference      Peak Flow      Pain Score      Pain Loc      Pain Edu?      Excl. in GC?    No data found.  Updated Vital Signs BP (!) 141/77 (BP Location: Right Arm)  Pulse 63   Temp (!) 97.4 F (36.3 C) (Oral)   Resp 16   SpO2 99%   Visual Acuity Right Eye Distance:   Left Eye Distance:   Bilateral Distance:    Right Eye Near:   Left Eye Near:    Bilateral Near:     Physical Exam  Skin:  Wound healing well.   Nursing note and vitals reviewed.    UC Treatments / Results  Labs (all labs ordered are listed, but only abnormal results are displayed) Labs Reviewed - No data to display  EKG None  Radiology No results found.  Procedures     Procedures (including critical care time)  Medications Ordered in UC Medications - No data to display  Initial Impression / Assessment and Plan / UC Course  I have reviewed the triage vital signs and the nursing notes.  Pertinent labs & imaging results that were available during my care of the patient were reviewed by me and considered in my medical decision making (see chart for details).      Wound well healing. Instructed to come back next week for suture removal.  Final Clinical Impressions(s) / UC Diagnoses   Final diagnoses:  Encounter for wound re-check     Discharge Instructions     Wound is well healing Follow up next week for suture removal.    ED Prescriptions    None     Controlled Substance Prescriptions [place] Controlled Substance Registry consulted? Not Applicable   Janace Aris, NP 03/11/18 1648

## 2018-03-11 NOTE — Discharge Instructions (Addendum)
Wound is well healing Follow up next week for suture removal.

## 2018-03-16 ENCOUNTER — Telehealth (HOSPITAL_COMMUNITY): Payer: Self-pay | Admitting: *Deleted

## 2018-03-16 NOTE — Telephone Encounter (Signed)
Patient called and canceled appt with afib clinic stating he was going to see his regular cardiologist.

## 2018-03-17 ENCOUNTER — Encounter (HOSPITAL_COMMUNITY): Payer: Self-pay | Admitting: Emergency Medicine

## 2018-03-17 ENCOUNTER — Ambulatory Visit (HOSPITAL_COMMUNITY)
Admission: EM | Admit: 2018-03-17 | Discharge: 2018-03-17 | Disposition: A | Discharge status: Home / Self Care | Payer: 59

## 2018-03-17 ENCOUNTER — Other Ambulatory Visit: Payer: Self-pay

## 2018-03-17 ENCOUNTER — Ambulatory Visit (HOSPITAL_COMMUNITY): Payer: 59 | Admitting: Nurse Practitioner

## 2018-03-17 DIAGNOSIS — S51012D Laceration without foreign body of left elbow, subsequent encounter: Secondary | ICD-10-CM

## 2018-03-17 DIAGNOSIS — Z4802 Encounter for removal of sutures: Secondary | ICD-10-CM

## 2018-03-17 NOTE — ED Triage Notes (Signed)
Patient is in department tonight for suture removal from left, proximal forearm.  Scab intact.  Skin around wound is intact.  Dr Milus Glazierlauenstein looked at wound for this nurse and agreed sutures to be removed.

## 2018-03-25 ENCOUNTER — Ambulatory Visit (HOSPITAL_COMMUNITY)
Admission: EM | Admit: 2018-03-25 | Discharge: 2018-03-25 | Disposition: A | Discharge status: Home / Self Care | Payer: 59

## 2018-03-25 DIAGNOSIS — S51012S Laceration without foreign body of left elbow, sequela: Secondary | ICD-10-CM

## 2018-03-25 DIAGNOSIS — Z1889 Other specified retained foreign body fragments: Secondary | ICD-10-CM

## 2018-03-25 DIAGNOSIS — Z4802 Encounter for removal of sutures: Secondary | ICD-10-CM

## 2018-03-25 NOTE — ED Triage Notes (Signed)
Pt here for suture removal.  He was here a week ago, but noticed a few more were still there.  Upon assessment, two sutures were noted to still be in the wound.  The scab had to be removed to remove the two sutures and they were removed successfully and intact.  The wound had a small amount of yellowish drainage, but there was no redness or swelling around the wound.  Dr.  Tracie Harrier assessed the wound and then Bacitracin and a band aid were applied.

## 2018-04-06 ENCOUNTER — Encounter: Payer: Self-pay | Admitting: Internal Medicine

## 2018-04-20 ENCOUNTER — Ambulatory Visit: Payer: 59 | Admitting: Internal Medicine

## 2018-04-20 ENCOUNTER — Encounter: Payer: Self-pay | Admitting: Internal Medicine

## 2018-04-20 VITALS — BP 108/74 | HR 58 | Ht 70.0 in | Wt 191.8 lb

## 2018-04-20 DIAGNOSIS — I480 Paroxysmal atrial fibrillation: Secondary | ICD-10-CM

## 2018-04-20 DIAGNOSIS — I4819 Other persistent atrial fibrillation: Secondary | ICD-10-CM | POA: Diagnosis not present

## 2018-04-20 NOTE — Patient Instructions (Addendum)
Medication Instructions:  Your physician recommends that you continue on your current medications as directed. Please refer to the Current Medication list given to you today.  Labwork: None ordered.  Testing/Procedures: Your physician has requested that you have an echocardiogram. Echocardiography is a painless test that uses sound waves to create images of your heart. It provides your doctor with information about the size and shape of your heart and how well your heart's chambers and valves are working. This procedure takes approximately one hour. There are no restrictions for this procedure.  Please schedule for ECHO  Follow-Up: Your physician wants you to follow-up in: one year with Dr. Johney Frame.   You will receive a reminder letter in the mail two months in advance. If you don't receive a letter, please call our office to schedule the follow-up appointment.  Any Other Special Instructions Will Be Listed Below (If Applicable).  If you need a refill on your cardiac medications before your next appointment, please call your pharmacy.
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
## 2018-04-30 ENCOUNTER — Other Ambulatory Visit: Payer: Self-pay

## 2018-04-30 ENCOUNTER — Ambulatory Visit (HOSPITAL_COMMUNITY): Payer: 59 | Attending: Cardiology

## 2018-04-30 DIAGNOSIS — I480 Paroxysmal atrial fibrillation: Secondary | ICD-10-CM | POA: Diagnosis present

## 2018-05-01 ENCOUNTER — Other Ambulatory Visit (HOSPITAL_COMMUNITY): Payer: 59

## 2018-05-06 ENCOUNTER — Telehealth: Payer: Self-pay | Admitting: Internal Medicine

## 2018-05-06 NOTE — Telephone Encounter (Signed)
Pt advised Echo results. 

## 2018-05-06 NOTE — Telephone Encounter (Signed)
New message ° ° °Patient is returning call about echo results. °

## 2018-06-07 ENCOUNTER — Other Ambulatory Visit: Payer: Self-pay | Admitting: Emergency Medicine

## 2018-11-15 ENCOUNTER — Encounter (HOSPITAL_COMMUNITY): Payer: Self-pay | Admitting: Emergency Medicine

## 2018-11-15 ENCOUNTER — Ambulatory Visit (INDEPENDENT_AMBULATORY_CARE_PROVIDER_SITE_OTHER): Payer: 59

## 2018-11-15 ENCOUNTER — Ambulatory Visit (HOSPITAL_COMMUNITY)
Admission: EM | Admit: 2018-11-15 | Discharge: 2018-11-15 | Disposition: A | Discharge status: Home / Self Care | Payer: 59 | Attending: Emergency Medicine | Admitting: Emergency Medicine

## 2018-11-15 DIAGNOSIS — T148XXA Other injury of unspecified body region, initial encounter: Secondary | ICD-10-CM | POA: Diagnosis not present

## 2018-11-15 MED ORDER — AMOXICILLIN-POT CLAVULANATE 875-125 MG PO TABS: 1.0000 | ORAL_TABLET | Freq: Two times a day (BID) | ORAL | 0 refills | Status: AC

## 2018-11-15 MED ORDER — HYDROCODONE-ACETAMINOPHEN 5-325 MG PO TABS: 1.0000 | ORAL_TABLET | Freq: Four times a day (QID) | ORAL | 0 refills | Status: DC | PRN
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
## 2018-11-15 NOTE — Discharge Instructions (Signed)
Cleanse wound daily with soap and water, keep covered to keep clean, avoid touching to prevent infection.  5 days of prophylactic antibiotics.  Tylenol, ibuprofen for pain, hydrocodone for breakthrough pain. May cause drowsiness. Please do not take if driving or drinking alcohol.  May cause constipation.  Ice, elevation.  If develop worsening of pain, redness, drainage or otherwise worsening please return or go to the ER.
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
## 2019-04-28 ENCOUNTER — Other Ambulatory Visit: Payer: Self-pay

## 2019-04-28 ENCOUNTER — Telehealth (INDEPENDENT_AMBULATORY_CARE_PROVIDER_SITE_OTHER): Payer: 59 | Admitting: Internal Medicine

## 2019-04-28 ENCOUNTER — Encounter: Payer: Self-pay | Admitting: Internal Medicine

## 2019-04-28 VITALS — BP 115/70 | HR 62 | Ht 70.0 in | Wt 180.0 lb

## 2019-04-28 DIAGNOSIS — I480 Paroxysmal atrial fibrillation: Secondary | ICD-10-CM | POA: Diagnosis not present
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
## 2019-05-10 ENCOUNTER — Ambulatory Visit: Payer: 59 | Admitting: Internal Medicine

## 2019-12-26 ENCOUNTER — Encounter (HOSPITAL_COMMUNITY): Payer: Self-pay | Admitting: Emergency Medicine

## 2019-12-26 ENCOUNTER — Ambulatory Visit (HOSPITAL_COMMUNITY)
Admission: EM | Admit: 2019-12-26 | Discharge: 2019-12-26 | Disposition: A | Discharge status: Home / Self Care | Payer: 59 | Attending: Internal Medicine | Admitting: Internal Medicine

## 2019-12-26 ENCOUNTER — Other Ambulatory Visit: Payer: Self-pay

## 2019-12-26 DIAGNOSIS — H5712 Ocular pain, left eye: Secondary | ICD-10-CM

## 2019-12-26 DIAGNOSIS — S0502XA Injury of conjunctiva and corneal abrasion without foreign body, left eye, initial encounter: Secondary | ICD-10-CM

## 2019-12-26 MED ORDER — TOBRAMYCIN 0.3 % OP SOLN: 2.0000 [drp] | OPHTHALMIC | 0 refills | Status: DC
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
## 2020-02-08 ENCOUNTER — Other Ambulatory Visit: Payer: Self-pay

## 2020-02-08 ENCOUNTER — Encounter (HOSPITAL_COMMUNITY): Payer: Self-pay

## 2020-02-08 ENCOUNTER — Ambulatory Visit (HOSPITAL_COMMUNITY)
Admission: EM | Admit: 2020-02-08 | Discharge: 2020-02-08 | Disposition: A | Discharge status: Home / Self Care | Payer: 59 | Attending: Family Medicine | Admitting: Family Medicine

## 2020-02-08 DIAGNOSIS — Z4802 Encounter for removal of sutures: Secondary | ICD-10-CM | POA: Diagnosis not present

## 2020-02-08 NOTE — ED Triage Notes (Signed)
Pt is here needing 9 suture removed on his left forarm/elbow area. Pt had these placed in [place], [place], Dr.Hagler laid eyes on the stitches too.

## 2020-02-11 NOTE — ED Provider Notes (Signed)
  [facility]   [id number] 02/08/20 Arrival Time: 1341  ASSESSMENT & PLAN:  1. Visit for suture removal      Suture removed without complication.  Reviewed expectations re: course of current medical issues. Questions answered. Outlined signs and symptoms indicating need for more acute intervention. Patient verbalized understanding. After Visit Summary given.   SUBJECTIVE: Chad Braun is a 54 y.o. male who presents for suture removal. Placed approx 9-10 days ago at outside hospital. No concerns.    OBJECTIVE: General appearance: alert; no distress Skin: healed wound of L forearm without signs of infection Psychological: alert and cooperative; normal mood and affect    Labs Reviewed - No data to display  No results found.  No Known Allergies  Past Medical History:  Diagnosis Date  . GERD (gastroesophageal reflux disease)   . IBS (irritable bowel syndrome)   . Mild tricuspid regurgitation   . Persistent atrial fibrillation (HCC)    Social History   Socioeconomic History  . Marital status: Married    Spouse name: Not on file  . Number of children: Not on file  . Years of education: Not on file  . Highest education level: Not on file  Occupational History    Employer: [employer]    Comment: Commercial garage dorr installation  Tobacco Use  . Smoking status: Never Smoker  . Smokeless tobacco: Never Used  Vaping Use  . Vaping Use: Never used  Substance and Sexual Activity  . Alcohol use: Yes    Alcohol/week: 2.0 standard drinks    Types: 1 Cans of beer, 1 Glasses of wine per week  . Drug use: No  . Sexual activity: Yes  Other Topics Concern  . Not on file  Social History Narrative   Works as a Contractor.   Social Determinants of Health   Financial Resource Strain:   . Difficulty of Paying Living Expenses:   Food Insecurity:   . Worried About Programme researcher, broadcasting/film/video in the Last Year:   . Barista in the Last Year:    Transportation Needs:   . Freight forwarder (Medical):   Marland Kitchen Lack of Transportation (Non-Medical):   Physical Activity:   . Days of Exercise per Week:   . Minutes of Exercise per Session:   Stress:   . Feeling of Stress :   Social Connections:   . Frequency of Communication with Friends and Family:   . Frequency of Social Gatherings with Friends and Family:   . Attends Religious Services:   . Active Member of Clubs or Organizations:   . Attends Banker Meetings:   Marland Kitchen Marital Status:          Mardella Layman, MD 02/11/20 1253

## 2020-05-01 ENCOUNTER — Encounter: Payer: Self-pay | Admitting: *Deleted

## 2020-05-01 ENCOUNTER — Encounter: Payer: Self-pay | Admitting: Internal Medicine

## 2020-05-01 ENCOUNTER — Other Ambulatory Visit: Payer: Self-pay

## 2020-05-01 ENCOUNTER — Ambulatory Visit: Payer: 59 | Admitting: Internal Medicine

## 2020-05-01 VITALS — BP 116/72 | HR 64 | Ht 70.0 in | Wt 191.0 lb

## 2020-05-01 DIAGNOSIS — I480 Paroxysmal atrial fibrillation: Secondary | ICD-10-CM

## 2020-05-01 NOTE — Patient Instructions (Signed)
Medication Instructions:  Your physician recommends that you continue on your current medications as directed. Please refer to the Current Medication list given to you today.  *If you need a refill on your cardiac medications before your next appointment, please call your pharmacy*  Lab Work: None ordered.  If you have labs (blood work) drawn today and your tests are completely normal, you will receive your results only by: . MyChart Message (if you have MyChart) OR . A paper copy in the mail If you have any lab test that is abnormal or we need to change your treatment, we will call you to review the results.  Testing/Procedures: None ordered.  Follow-Up: At [facility], you and your health needs are our priority.  As part of our continuing mission to provide you with exceptional heart care, we have created designated Provider Care Teams.  These Care Teams include your primary Cardiologist (physician) and Advanced Practice Providers (APPs -  Physician Assistants and Nurse Practitioners) who all work together to provide you with the care you need, when you need it.  We recommend signing up for the patient portal called "MyChart".  Sign up information is provided on this After Visit Summary.  MyChart is used to connect with patients for Virtual Visits (Telemedicine).  Patients are able to view lab/test results, encounter notes, upcoming appointments, etc.  Non-urgent messages can be sent to your provider as well.   To learn more about what you can do with MyChart, go to [email or web address].    Your next appointment:   Your physician wants you to follow-up in: 1 year with Dr. Allred. You will receive a reminder letter in the mail two months in advance. If you don't receive a letter, please call our office to schedule the follow-up appointment.   Other Instructions:  

## 2020-05-01 NOTE — Progress Notes (Signed)
   PCP: Patient, No Pcp Per   Primary EP: Dr Hassell Halim is a 54 y.o. male who presents today for routine electrophysiology followup.  Since last being seen in our clinic, the patient reports doing very well.  Today, he denies symptoms of palpitations, chest pain, shortness of breath,  lower extremity edema, dizziness, presyncope, or syncope.  He continues to ride [employer] and do extensive bike racing.  He denies cardiac limitations.  afib is well controlled. The patient is otherwise without complaint today.   Past Medical History:  Diagnosis Date  . GERD (gastroesophageal reflux disease)   . IBS (irritable bowel syndrome)   . Mild tricuspid regurgitation   . Persistent atrial fibrillation [facility])    Past Surgical History:  Procedure Laterality Date  . ANKLE FRACTURE SURGERY Right 1998  . APPENDECTOMY  11/08/2015  . ATRIAL FIBRILLATION ABLATION N/A 07/07/2012   Procedure: ATRIAL FIBRILLATION ABLATION;  Surgeon: Hillis Range, MD;  Location: [facility] CATH LAB;  Service: Cardiovascular;  Laterality: N/A;  . CARDIAC CATHETERIZATION  01/21/2011  . CARDIOVERSION  05/2012   "unsuccessful"  . LAPAROSCOPIC APPENDECTOMY N/A 11/08/2015   Procedure: APPENDECTOMY LAPAROSCOPIC;  Surgeon: Jimmye Norman, MD;  Location: [facility] OR;  Service: General;  Laterality: N/A;  . TEE WITHOUT CARDIOVERSION  07/07/2012   Procedure: TRANSESOPHAGEAL ECHOCARDIOGRAM (TEE);  Surgeon: Dolores Patty, MD;  Location: [facility] ENDOSCOPY;  Service: Cardiovascular;  Laterality: N/A;  . TIBIA FRACTURE SURGERY Right 1998    ROS- all systems are reviewed and negatives except as per HPI above  Current Outpatient Medications  Medication Sig Dispense Refill  . HYDROcodone-acetaminophen (NORCO/VICODIN) 5-325 MG tablet Take 1 tablet by mouth every 6 (six) hours as needed for severe pain. (Patient not taking: Reported on 12/26/2019) 5 tablet 0  . tobramycin (TOBREX) 0.3 % ophthalmic solution Place 2 drops into the left eye every 4  (four) hours. 5 mL 0   No current facility-administered medications for this visit.    Physical Exam: Vitals:   05/01/20 1627  BP: 116/72  Pulse: 64  SpO2: 96%  Weight: 191 lb (86.6 kg)  Height: 5\' 10"  (1.778 m)    GEN- The patient is well appearing, alert and oriented x 3 today.   Head- normocephalic, atraumatic Eyes-  Sclera clear, conjunctiva pink Ears- hearing intact Oropharynx- clear Lungs- Clear to ausculation bilaterally, normal work of breathing Heart- Regular rate and rhythm, no murmurs, rubs or gallops, PMI not laterally displaced GI- soft, NT, ND, + BS Extremities- no clubbing, cyanosis, or edema  Wt Readings from Last 3 Encounters:  05/01/20 191 lb (86.6 kg)  04/28/19 180 lb (81.6 kg)  04/20/18 191 lb 12.8 oz (87 kg)    EKG tracing ordered today is personally reviewed and shows sinus rhythm  Assessment and Plan:  1. Paroxysmal atrial fibrillation He has done well since ablation 2014 off of AAD therapy chads2vasc score is 0.  He does not require OAC therapy  Risks, benefits and potential toxicities for medications prescribed and/or refilled reviewed with patient today.   Return in a year  04/22/18 MD, [facility] 05/01/2020 4:43 PM

## 2021-05-11 ENCOUNTER — Encounter: Payer: Self-pay | Admitting: *Deleted

## 2021-05-11 ENCOUNTER — Ambulatory Visit: Payer: 59 | Admitting: Internal Medicine

## 2021-05-11 ENCOUNTER — Encounter: Payer: Self-pay | Admitting: Internal Medicine

## 2021-05-11 ENCOUNTER — Other Ambulatory Visit: Payer: Self-pay

## 2021-05-11 VITALS — BP 104/62 | HR 61 | Ht 70.0 in | Wt 192.0 lb

## 2021-05-11 DIAGNOSIS — I480 Paroxysmal atrial fibrillation: Secondary | ICD-10-CM

## 2021-05-11 NOTE — Progress Notes (Signed)
   PCP: Ileana Ladd, MD   Primary EP: Dr Hassell Halim is a 55 y.o. male who presents today for routine electrophysiology followup.  Since last being seen in our clinic, the patient reports doing very well.  Today, he denies symptoms of palpitations, chest pain, shortness of breath,  lower extremity edema, dizziness, presyncope, or syncope.  The patient is otherwise without complaint today.   Past Medical History:  Diagnosis Date   GERD (gastroesophageal reflux disease)    IBS (irritable bowel syndrome)    Mild tricuspid regurgitation    Persistent atrial fibrillation [facility])    Past Surgical History:  Procedure Laterality Date   ANKLE FRACTURE SURGERY Right 1998   APPENDECTOMY  11/08/2015   ATRIAL FIBRILLATION ABLATION N/A 07/07/2012   Procedure: ATRIAL FIBRILLATION ABLATION;  Surgeon: Hillis Range, MD;  Location: [facility] CATH LAB;  Service: Cardiovascular;  Laterality: N/A;   CARDIAC CATHETERIZATION  01/21/2011   CARDIOVERSION  05/2012   "unsuccessful"   LAPAROSCOPIC APPENDECTOMY N/A 11/08/2015   Procedure: APPENDECTOMY LAPAROSCOPIC;  Surgeon: Jimmye Norman, MD;  Location: [facility] OR;  Service: General;  Laterality: N/A;   TEE WITHOUT CARDIOVERSION  07/07/2012   Procedure: TRANSESOPHAGEAL ECHOCARDIOGRAM (TEE);  Surgeon: Dolores Patty, MD;  Location: [facility] ENDOSCOPY;  Service: Cardiovascular;  Laterality: N/A;   TIBIA FRACTURE SURGERY Right 1998    ROS- all systems are reviewed and negatives except as per HPI above  No current outpatient medications on file.   No current facility-administered medications for this visit.    Physical Exam: Vitals:   05/11/21 1527  BP: 104/62  Pulse: 61  SpO2: 98%  Weight: 192 lb (87.1 kg)  Height: 5\' 10"  (1.778 m)    GEN- The patient is well appearing, alert and oriented x 3 today.   Head- normocephalic, atraumatic Eyes-  Sclera clear, conjunctiva pink Ears- hearing intact Oropharynx- clear Lungs- Clear to ausculation bilaterally,  normal work of breathing Heart- Regular rate and rhythm, no murmurs, rubs or gallops, PMI not laterally displaced GI- soft, NT, ND, + BS Extremities- no clubbing, cyanosis, or edema  Wt Readings from Last 3 Encounters:  05/11/21 192 lb (87.1 kg)  05/01/20 191 lb (86.6 kg)  04/28/19 180 lb (81.6 kg)    EKG tracing ordered today is personally reviewed and shows sinus  Assessment and Plan:  Paroxysmal atrial fibrillation Well controlled post ablation 2014 off AAD therapy Chads2vasc score is 0.  He does not require OAC  Risks, benefits and potential toxicities for medications prescribed and/or refilled reviewed with patient today.   04/30/19 MD, [facility] 05/11/2021 3:55 PM

## 2021-05-11 NOTE — Patient Instructions (Addendum)
Medication Instructions:  Your physician recommends that you continue on your current medications as directed. Please refer to the Current Medication list given to you today. *If you need a refill on your cardiac medications before your next appointment, please call your pharmacy*  Lab Work: None. If you have labs (blood work) drawn today and your tests are completely normal, you will receive your results only by: MyChart Message (if you have MyChart) OR A paper copy in the mail If you have any lab test that is abnormal or we need to change your treatment, we will call you to review the results.  Testing/Procedures: None.  Follow-Up: At [facility], you and your health needs are our priority.  As part of our continuing mission to provide you with exceptional heart care, we have created designated Provider Care Teams.  These Care Teams include your primary Cardiologist (physician) and Advanced Practice Providers (APPs -  Physician Assistants and Nurse Practitioners) who all work together to provide you with the care you need, when you need it.  Your physician wants you to follow-up in: 12 months with  the Afib Clinic. They will contact you to schedule.     You will receive a reminder letter in the mail two months in advance. If you don't receive a letter, please call our office to schedule the follow-up appointment.  We recommend signing up for the patient portal called "MyChart".  Sign up information is provided on this After Visit Summary.  MyChart is used to connect with patients for Virtual Visits (Telemedicine).  Patients are able to view lab/test results, encounter notes, upcoming appointments, etc.  Non-urgent messages can be sent to your provider as well.   To learn more about what you can do with MyChart, go to [email or web address].    Any Other Special Instructions Will Be Listed Below (If Applicable).         

## 2021-05-25 ENCOUNTER — Other Ambulatory Visit: Payer: Self-pay

## 2021-05-25 ENCOUNTER — Ambulatory Visit (HOSPITAL_COMMUNITY)
Admission: EM | Admit: 2021-05-25 | Discharge: 2021-05-25 | Disposition: A | Discharge status: Home / Self Care | Payer: 59 | Attending: Emergency Medicine | Admitting: Emergency Medicine

## 2021-05-25 ENCOUNTER — Encounter (HOSPITAL_COMMUNITY): Payer: Self-pay | Admitting: Emergency Medicine

## 2021-05-25 DIAGNOSIS — H5712 Ocular pain, left eye: Secondary | ICD-10-CM | POA: Diagnosis not present

## 2021-05-25 DIAGNOSIS — J0120 Acute ethmoidal sinusitis, unspecified: Secondary | ICD-10-CM

## 2021-05-25 MED ORDER — FLUTICASONE PROPIONATE 50 MCG/ACT NA SUSP: 2.0000 | Freq: Every day | NASAL | 0 refills | Status: DC

## 2021-05-25 MED ORDER — AMOXICILLIN-POT CLAVULANATE 875-125 MG PO TABS: 1.0000 | ORAL_TABLET | Freq: Two times a day (BID) | ORAL | 0 refills | Status: DC

## 2021-05-25 MED ORDER — TETRACAINE HCL 0.5 % OP SOLN
1.0000 [drp] | Freq: Once | OPHTHALMIC | Status: AC
  Administered 2021-05-25: 2 [drp] via OPHTHALMIC

## 2021-05-25 MED ORDER — FLUORESCEIN SODIUM 1 MG OP STRP
1.0000 | ORAL_STRIP | Freq: Once | OPHTHALMIC | Status: AC
  Administered 2021-05-25: 1 via OPHTHALMIC

## 2021-05-25 MED ORDER — IBUPROFEN 600 MG PO TABS: 600.0000 mg | ORAL_TABLET | Freq: Four times a day (QID) | ORAL | 0 refills | Status: DC | PRN

## 2021-05-25 MED ORDER — TETRACAINE HCL 0.5 % OP SOLN
OPHTHALMIC | Status: AC
  Filled 2021-05-25: qty 4

## 2021-05-25 NOTE — Discharge Instructions (Addendum)
Flonase, finish the Augmentin, saline nasal irrigation with a NeilMed sinus rinse and distilled water as often as you want.  Try 600 mg of ibuprofen combined with 1000 mg of Tylenol 3-4 times a day as needed for pain.  Go to the emergency department for fevers above 100.4, visual changes, if you get worse, or for any concerns.  May follow-up here or with your doctor if a rash shows up on your face we can start you on antivirals for shingles.  If you are not getting any better in 72 hours, please follow-up with [facility].

## 2021-05-25 NOTE — ED Triage Notes (Signed)
Pt report had intermittent stabbing eye pains since Tuesday that have become more frequent over the past couple days. Reports more glare on lights last night. They will last about 5-10 seconds.

## 2021-05-25 NOTE — ED Provider Notes (Signed)
HPI  SUBJECTIVE:  Chad Braun is a 55 y.o. male who presents with 4 days of intermittent sharp pain in between his nasal bridge and his eye.  It can last up to 10 seconds and then resolve.  He reports seeing halos around lights last night, but none since.  He denies eye pain worse in the dark.  it is not associated with exercise.  He denies foreign body sensation in his eye, fevers, headaches, blurry vision, double vision, visual loss, conjunctival injection, discharge, pain with extraocular movements, photophobia.  No periorbital erythema, swelling.  He reports left-sided nasal congestion, but states this is an ongoing issue.  No sinus pain or pressure, purulent rhinorrhea.  No facial rash, paresthesias, ear pain.  No aggravating or alleviating factors.  He has not tried anything for this.  He had an eye exam a week and a half ago and was diagnosed with dry eye.  He has a past medical history of atrial fibrillation which resolved with an ablation.  He is currently not on any anticoagulants or antiplatelets. no history of glaucoma, diabetes, hypertension.  QQP:YPPJ, Maryla Morrow, MD Ophthalmology: [facility]    Past Medical History:  Diagnosis Date   GERD (gastroesophageal reflux disease)    IBS (irritable bowel syndrome)    Mild tricuspid regurgitation    Persistent atrial fibrillation [facility])     Past Surgical History:  Procedure Laterality Date   ANKLE FRACTURE SURGERY Right 1998   APPENDECTOMY  11/08/2015   ATRIAL FIBRILLATION ABLATION N/A 07/07/2012   Procedure: ATRIAL FIBRILLATION ABLATION;  Surgeon: Hillis Range, MD;  Location: [facility] CATH LAB;  Service: Cardiovascular;  Laterality: N/A;   CARDIAC CATHETERIZATION  01/21/2011   CARDIOVERSION  05/2012   "unsuccessful"   LAPAROSCOPIC APPENDECTOMY N/A 11/08/2015   Procedure: APPENDECTOMY LAPAROSCOPIC;  Surgeon: Jimmye Norman, MD;  Location: [facility] OR;  Service: General;  Laterality: N/A;   TEE WITHOUT CARDIOVERSION  07/07/2012    Procedure: TRANSESOPHAGEAL ECHOCARDIOGRAM (TEE);  Surgeon: Dolores Patty, MD;  Location: [facility] ENDOSCOPY;  Service: Cardiovascular;  Laterality: N/A;   TIBIA FRACTURE SURGERY Right 1998    Family History  Problem Relation Age of Onset   Breast cancer Mother    Lung cancer Mother    Colon cancer Father     Social History   Tobacco Use   Smoking status: Never   Smokeless tobacco: Never  Vaping Use   Vaping Use: Never used  Substance Use Topics   Alcohol use: Yes    Alcohol/week: 2.0 standard drinks    Types: 1 Cans of beer, 1 Glasses of wine per week   Drug use: No    No current facility-administered medications for this encounter.  Current Outpatient Medications:    amoxicillin-clavulanate (AUGMENTIN) 875-125 MG tablet, Take 1 tablet by mouth 2 (two) times daily. X 7 days, Disp: 14 tablet, Rfl: 0   fluticasone (FLONASE) 50 MCG/ACT nasal spray, Place 2 sprays into both nostrils daily., Disp: 16 g, Rfl: 0   ibuprofen (ADVIL) 600 MG tablet, Take 1 tablet (600 mg total) by mouth every 6 (six) hours as needed., Disp: 30 tablet, Rfl: 0  No Known Allergies   ROS  As noted in HPI.   Physical Exam  BP 133/74 (BP Location: Left Arm)   Pulse (!) 54   Temp 98.4 F (36.9 C) (Oral)   Resp 16   SpO2 99%   Constitutional: Well developed, well nourished, no acute distress Eyes: PERRL, EOMI, conjunctiva normal bilaterally.  No surrounding erythema, edema.  Mild pain with looking down and out for prolonged period of time.  No evidence of entrapment.  No orbital rim tenderness.  No direct or consensual photophobia, no hyphema, no foreign body seen on lid eversion. IOP left 12        Right 13 Funduscopic normal bilaterally   Visual Acuity  Right Eye Distance: 20/30 Left Eye Distance: 20/30 Bilateral Distance: 20/25  Right Eye Near:   Left Eye Near:    Bilateral Near:       HENT: Normocephalic, atraumatic,mucus membranes moist.  Positive nasal congestion.  Positive left  maxillary sinus tenderness, tenderness along the left nasal bridge between the bone and eye. Respiratory: Normal inspiratory effort Cardiovascular: Normal rate and rhythm, no murmurs, no gallops, no rubs GI: nondistended, skin: No facial or left ear canal rash, skin intact Musculoskeletal: no deformities Neurologic: Alert & oriented x 3, CN III-XII grossly intact, no motor deficits, sensation grossly intact Psychiatric: Speech and behavior appropriate   ED Course   Medications  tetracaine (PONTOCAINE) 0.5 % ophthalmic solution 1-2 drop (2 drops Left Eye Given by Other 05/25/21 0934)  fluorescein ophthalmic strip 1 strip (1 strip Left Eye Given 05/25/21 0935)    No orders of the defined types were placed in this encounter.  No results found for this or any previous visit (from the past 24 hour(s)). No results found.  ED Clinical Impression  1. Acute non-recurrent ethmoidal sinusitis   2. Left eye pain      ED Assessment/Plan  No evidence of glaucoma.  Doubt CRAO or CRVO.  Doubt post septal cellulitis although this is in the differential.  No evidence of periorbital cellulitis, shingles.  Unsure as to the etiology of patient's symptoms but with the nasal congestion, left-sided maxillary sinus tenderness, wonder if he could have an ethmoid sinusitis.  He states that he gets sinus infections frequently.  will try Flonase, Augmentin, saline nasal irrigation.  Follow-up with his ophthalmologist if not getting any better in 48 to 72 hours.  He is to go to the ER if he gets worse.  Discussed MDM, treatment plan, and plan for follow-up with patient Discussed sn/sx that should prompt return to the ED. patient agrees with plan.   Meds ordered this encounter  Medications   tetracaine (PONTOCAINE) 0.5 % ophthalmic solution 1-2 drop   fluorescein ophthalmic strip 1 strip   fluticasone (FLONASE) 50 MCG/ACT nasal spray    Sig: Place 2 sprays into both nostrils daily.    Dispense:  16 g     Refill:  0   amoxicillin-clavulanate (AUGMENTIN) 875-125 MG tablet    Sig: Take 1 tablet by mouth 2 (two) times daily. X 7 days    Dispense:  14 tablet    Refill:  0   ibuprofen (ADVIL) 600 MG tablet    Sig: Take 1 tablet (600 mg total) by mouth every 6 (six) hours as needed.    Dispense:  30 tablet    Refill:  0      *This clinic note was created using Scientist, clinical (histocompatibility and immunogenetics). Therefore, there may be occasional mistakes despite careful proofreading. ?    Domenick Gong, MD 05/26/21 [phone]

## 2021-05-27 ENCOUNTER — Emergency Department (HOSPITAL_COMMUNITY): Payer: 59

## 2021-05-27 ENCOUNTER — Encounter (HOSPITAL_COMMUNITY): Payer: Self-pay | Admitting: Emergency Medicine

## 2021-05-27 ENCOUNTER — Other Ambulatory Visit: Payer: Self-pay

## 2021-05-27 ENCOUNTER — Ambulatory Visit (HOSPITAL_COMMUNITY): Admission: EM | Admit: 2021-05-27 | Discharge: 2021-05-27 | Discharge status: Against Medical Advice | Payer: 59

## 2021-05-27 ENCOUNTER — Emergency Department (HOSPITAL_COMMUNITY)
Admission: EM | Admit: 2021-05-27 | Discharge: 2021-05-28 | Disposition: A | Discharge status: Home / Self Care | Payer: 59 | Attending: Emergency Medicine | Admitting: Emergency Medicine

## 2021-05-27 DIAGNOSIS — H5712 Ocular pain, left eye: Secondary | ICD-10-CM | POA: Insufficient documentation

## 2021-05-27 DIAGNOSIS — R519 Headache, unspecified: Secondary | ICD-10-CM | POA: Diagnosis not present

## 2021-05-27 DIAGNOSIS — Z9889 Other specified postprocedural states: Secondary | ICD-10-CM | POA: Diagnosis not present

## 2021-05-27 DIAGNOSIS — Z7901 Long term (current) use of anticoagulants: Secondary | ICD-10-CM | POA: Diagnosis not present

## 2021-05-27 NOTE — ED Triage Notes (Signed)
Pt here for intermitting stabbing pain behind L eye for 3-5 days now. Pt reports pain episodes last about 10 seconds and are about every 10-15 min. Pt denies any visual problems, does note a small halo w/ L eye. Denies hx of migraines.

## 2021-05-27 NOTE — ED Provider Notes (Signed)
MSE was initiated and I personally evaluated the patient and placed orders (if any) at  10:47 PM on May 27, 2021.   11/22 sharp pain lasting 10-20 seconds behind left eye. Urgent care 11/25, normal exam, including pressures. Treated with abx, got better then worse today with increased intensity and frequency. No visual change, no nausea.  PERRL No pain with eye movement Mild ethmoid tenderness  The patient appears stable so that the remainder of the MSE may be completed by another provider.   Elpidio Anis, PA-C 05/27/21 2252    Cheryll Cockayne, MD 05/30/21 1438

## 2021-05-28 MED ORDER — LAMOTRIGINE 25 MG PO TABS: 25.0000 mg | ORAL_TABLET | Freq: Every day | ORAL | 0 refills | Status: DC

## 2021-05-28 MED ORDER — LAMOTRIGINE 25 MG PO TABS
25.0000 mg | ORAL_TABLET | Freq: Once | ORAL | Status: AC
  Administered 2021-05-28: 02:00:00 25 mg via ORAL
  Filled 2021-05-28: qty 1

## 2021-05-28 NOTE — ED Provider Notes (Signed)
[facility] EMERGENCY DEPARTMENT Provider Note   CSN: [id number] Arrival date & time: 05/27/21  2138     History Chief Complaint  Patient presents with   Headache    Chad Braun is a 55 y.o. male.  The history is provided by the patient and medical records.  Headache Chad Braun is a 55 y.o. male who presents to the Emergency Department complaining of eye pain. He presents to the emergency department for evaluation of left eye pain that started acutely on November 22. The pain is located behind the left eye and is described as sharp, episodes lasting 5 to 10 seconds at a time. He was evaluated to urgent care on Friday and was started on antibiotics (augmentin) for possible sinusitis.  Pressure at urgent care were 13 and 16.   His symptoms were mildly improved on Saturday only to recur and worsened today. He is now having episodes every 30 to 60 minutes. He has no associated numbness, weakness, vision changes. He does wear glasses for vision correction. He sees Dr. [facility]. He does have a history of a fib status post ablation in 2014. Friday abx       Past Medical History:  Diagnosis Date   GERD (gastroesophageal reflux disease)    IBS (irritable bowel syndrome)    Mild tricuspid regurgitation    Persistent atrial fibrillation [facility])     Patient Active Problem List   Diagnosis Date Noted   Acute appendicitis 11/08/2015   Paresthesia and pain of left extremity 08/02/2015   Atrial fibrillation (HCC) 02/19/2011   Asymptomatic LV dysfunction 02/19/2011   Chronic anticoagulation 02/19/2011    Past Surgical History:  Procedure Laterality Date   ANKLE FRACTURE SURGERY Right 1998   APPENDECTOMY  11/08/2015   ATRIAL FIBRILLATION ABLATION N/A 07/07/2012   Procedure: ATRIAL FIBRILLATION ABLATION;  Surgeon: Hillis Range, MD;  Location: [facility] CATH LAB;  Service: Cardiovascular;  Laterality: N/A;   CARDIAC CATHETERIZATION  01/21/2011   CARDIOVERSION  05/2012    "unsuccessful"   LAPAROSCOPIC APPENDECTOMY N/A 11/08/2015   Procedure: APPENDECTOMY LAPAROSCOPIC;  Surgeon: Jimmye Norman, MD;  Location[facility] OR;  Service: General;  Laterality: N/A;   TEE WITHOUT CARDIOVERSION  07/07/2012   Procedure: TRANSESOPHAGEAL ECHOCARDIOGRAM (TEE);  Surgeon: Dolores Patty, MD;  Location: [facility] ENDOSCOPY;  Service: Cardiovascular;  Laterality: N/A;   TIBIA FRACTURE SURGERY Right 1998       Family History  Problem Relation Age of Onset   Breast cancer Mother    Lung cancer Mother    Colon cancer Father     Social History   Tobacco Use   Smoking status: Never   Smokeless tobacco: Never  Vaping Use   Vaping Use: Never used  Substance Use Topics   Alcohol use: Yes    Alcohol/week: 2.0 standard drinks    Types: 1 Cans of beer, 1 Glasses of wine per week   Drug use: No    Home Medications Prior to Admission medications   Medication Sig Start Date End Date Taking? Authorizing Provider  amoxicillin-clavulanate (AUGMENTIN) 875-125 MG tablet Take 1 tablet by mouth 2 (two) times daily. X 7 days 05/25/21   Domenick Gong, MD  fluticasone [facility]) 50 MCG/ACT nasal spray Place 2 sprays into both nostrils daily. 05/25/21   Domenick Gong, MD  ibuprofen (ADVIL) 600 MG tablet Take 1 tablet (600 mg total) by mouth every 6 (six) hours as needed. 05/25/21   Domenick Gong, MD    Allergies  Patient has no known allergies.  Review of Systems   Review of Systems  Neurological:  Positive for headaches.  All other systems reviewed and are negative.  Physical Exam Updated Vital Signs BP 123/85   Pulse (!) 57   Temp 98.1 F (36.7 C) (Oral)   Resp 18   SpO2 96%   Physical Exam Vitals and nursing note reviewed.  Constitutional:      Appearance: He is well-developed.  HENT:     Head: Normocephalic and atraumatic.  Eyes:     Extraocular Movements: Extraocular movements intact.     Pupils: Pupils are equal, round, and reactive to light.   Cardiovascular:     Rate and Rhythm: Normal rate and regular rhythm.     Heart sounds: No murmur heard. Pulmonary:     Effort: Pulmonary effort is normal. No respiratory distress.     Breath sounds: Normal breath sounds.  Abdominal:     Palpations: Abdomen is soft.     Tenderness: There is no abdominal tenderness. There is no guarding or rebound.  Musculoskeletal:        General: No tenderness.  Skin:    General: Skin is warm and dry.  Neurological:     Mental Status: He is alert and oriented to person, place, and time.     Comments: Visual fields grossly intact. No asymmetry of facial movements. Five out of five strength in all four extremities.  Psychiatric:        Behavior: Behavior normal.    ED Results / Procedures / Treatments   Labs (all labs ordered are listed, but only abnormal results are displayed) Labs Reviewed - No data to display  EKG None  Radiology CT Head Wo Contrast  Result Date: 05/27/2021 CLINICAL DATA:  Headache. EXAM: CT HEAD WITHOUT CONTRAST TECHNIQUE: Contiguous axial images were obtained from the base of the skull through the vertex without intravenous contrast. COMPARISON:  CT head 07/17/2011. FINDINGS: Brain: No evidence of acute infarction, hemorrhage, hydrocephalus, extra-axial collection or mass lesion/mass effect. Vascular: No hyperdense vessel or unexpected calcification. Skull: Normal. Negative for fracture or focal lesion. Sinuses/Orbits: No acute finding. Other: None. IMPRESSION: No acute intracranial abnormality. Electronically Signed   By: Darliss Cheney M.D.   On: 05/27/2021 23:59    Procedures Procedures   Medications Ordered in ED Medications  lamoTRIgine (LAMICTAL) tablet 25 mg (has no administration in time range)    ED Course  I have reviewed the triage vital signs and the nursing notes.  Pertinent labs & imaging results that were available during my care of the patient were reviewed by me and considered in my medical decision  making (see chart for details).    MDM Rules/Calculators/A&P                          patient here for evaluation of left eye pain. He is neurologically intact on evaluation with no evidence of acute infectious process. CT scan is without acute abnormality. Presentation is not consistent with temporal arteritis, CVA, retinal detachment. 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 Plan to start Lamictal for symptom management with outpatient neurology, ophthalmology follow-up return precautions.  Final Clinical Impression(s) / ED Diagnoses Final diagnoses:  Eye pain, left    Rx / DC Orders ED Discharge Orders          Ordered    Ambulatory referral to Neurology       Comments: An appointment  is requested in approximately: 2 weeks   05/28/21 0136             Tilden Fossa, MD 05/28/21 [phone]

## 2021-05-28 NOTE — ED Notes (Signed)
20/25 bilaterally via visual acuity. 

## 2021-05-28 NOTE — Discharge Instructions (Addendum)
The neurologist is concerned for possible SUNCT/SUNA that is causing your symptoms.  Get rechecked if you develop new or concerning symptoms.

## 2021-05-28 NOTE — ED Notes (Signed)
Patient verbalizes understanding of discharge instructions. Opportunity for questioning and answers were provided. Armband removed by staff, pt discharged from ED ambulatory.   

## 2022-05-07 ENCOUNTER — Encounter (HOSPITAL_COMMUNITY): Payer: Self-pay | Admitting: Nurse Practitioner

## 2022-05-07 ENCOUNTER — Ambulatory Visit (HOSPITAL_COMMUNITY)
Admission: RE | Admit: 2022-05-07 | Discharge: 2022-05-07 | Disposition: A | Discharge status: Home / Self Care | Payer: 59 | Source: Ambulatory Visit | Attending: Nurse Practitioner | Admitting: Nurse Practitioner

## 2022-05-07 VITALS — BP 110/74 | HR 60 | Ht 70.0 in | Wt 198.2 lb

## 2022-05-07 DIAGNOSIS — I480 Paroxysmal atrial fibrillation: Secondary | ICD-10-CM | POA: Diagnosis not present

## 2022-05-07 DIAGNOSIS — I4891 Unspecified atrial fibrillation: Secondary | ICD-10-CM | POA: Diagnosis present

## 2022-05-07 NOTE — Progress Notes (Signed)
Primary Care Physician: Ileana Ladd, MD (Inactive) Referring Physician: prior pt of Dr. Hassell Halim is a 56 y.o. male with a h/o afib ablation in 2014 that is in afib clinic for yearly f/u. He states no afib, feels well. No complaints. Is not on anticoagulation with CHA2DS2VASc  score of 0.    Today, he denies symptoms of palpitations, chest pain, shortness of breath, orthopnea, PND, lower extremity edema, dizziness, presyncope, syncope, or neurologic sequela. The patient is tolerating medications without difficulties and is otherwise without complaint today.   Past Medical History:  Diagnosis Date   GERD (gastroesophageal reflux disease)    IBS (irritable bowel syndrome)    Mild tricuspid regurgitation    Persistent atrial fibrillation [facility])    Past Surgical History:  Procedure Laterality Date   ANKLE FRACTURE SURGERY Right 1998   APPENDECTOMY  11/08/2015   ATRIAL FIBRILLATION ABLATION N/A 07/07/2012   Procedure: ATRIAL FIBRILLATION ABLATION;  Surgeon: Hillis Range, MD;  Location: [facility] CATH LAB;  Service: Cardiovascular;  Laterality: N/A;   CARDIAC CATHETERIZATION  01/21/2011   CARDIOVERSION  05/2012   "unsuccessful"   LAPAROSCOPIC APPENDECTOMY N/A 11/08/2015   Procedure: APPENDECTOMY LAPAROSCOPIC;  Surgeon: Jimmye Norman, MD;  Location: [facility] OR;  Service: General;  Laterality: N/A;   TEE WITHOUT CARDIOVERSION  07/07/2012   Procedure: TRANSESOPHAGEAL ECHOCARDIOGRAM (TEE);  Surgeon: Dolores Patty, MD;  Location: [facility] ENDOSCOPY;  Service: Cardiovascular;  Laterality: N/A;   TIBIA FRACTURE SURGERY Right 1998    Current Outpatient Medications  Medication Sig Dispense Refill   Ibuprofen (ADVIL PO) Take 2 tablets by mouth as needed.     omeprazole (PRILOSEC) 40 MG capsule Take 40 mg by mouth daily.     No current facility-administered medications for this encounter.    No Known Allergies  Social History   Socioeconomic History   Marital status: Married    Spouse  name: Not on file   Number of children: Not on file   Years of education: Not on file   Highest education level: Not on file  Occupational History    Employer: [employer]    Comment: Engineer, structural  Tobacco Use   Smoking status: Never   Smokeless tobacco: Never  Vaping Use   Vaping Use: Never used  Substance and Sexual Activity   Alcohol use: Yes    Alcohol/week: 2.0 standard drinks of alcohol    Types: 1 Cans of beer, 1 Glasses of wine per week   Drug use: No   Sexual activity: Yes  Other Topics Concern   Not on file  Social History Narrative   Works as a Contractor.   Social Determinants of Health   Financial Resource Strain: Not on file  Food Insecurity: Not on file  Transportation Needs: Not on file  Physical Activity: Not on file  Stress: Not on file  Social Connections: Not on file  Intimate Partner Violence: Not on file    Family History  Problem Relation Age of Onset   Breast cancer Mother    Lung cancer Mother    Colon cancer Father     ROS- All systems are reviewed and negative except as per the HPI above  Physical Exam: Vitals:   05/07/22 1517  BP: 110/74  Pulse: 60  Weight: 89.9 kg  Height: 5\' 10"  (1.778 m)   Wt Readings from Last 3 Encounters:  05/07/22 89.9 kg  05/11/21 87.1 kg  05/01/20 86.6  kg    Labs: Lab Results  Component Value Date   NA 138 03/08/2018   K 3.6 03/08/2018   CL 102 03/08/2018   CO2 26 03/08/2018   GLUCOSE 124 (H) 03/08/2018   BUN 12 03/08/2018   CREATININE 0.89 03/08/2018   CALCIUM 9.4 03/08/2018   MG 1.9 04/19/2011   No results found for: "INR" No results found for: "CHOL", "HDL", "LDLCALC", "TRIG"   GEN- The patient is well appearing, alert and oriented x 3 today.   Head- normocephalic, atraumatic Eyes-  Sclera clear, conjunctiva pink Ears- hearing intact Oropharynx- clear Neck- supple, no JVP Lymph- no cervical lymphadenopathy Lungs- Clear to ausculation  bilaterally, normal work of breathing Heart- Regular rate and rhythm, no murmurs, rubs or gallops, PMI not laterally displaced GI- soft, NT, ND, + BS Extremities- no clubbing, cyanosis, or edema MS- no significant deformity or atrophy Skin- no rash or lesion Psych- euthymic mood, full affect Neuro- strength and sensation are intact  EKG- Vent. rate 60 BPM PR interval 164 ms QRS duration 88 ms QT/QTcB 406/406 ms P-R-T axes 83 84 62 Normal sinus rhythm Minimal voltage criteria for LVH, may be normal variant ( Sokolow-Lyon ) Borderline ECG When compared with ECG of 09-Mar-2018 01:47, PREVIOUS ECG IS PRESENT    Assessment and Plan:  1. Afib  He has not appreciated any afib since cardioversion in 2019 He is not on any daily meds for afib CHA2DS2VASc  score is 0 so no need for anticoagulation per guidelines I offered to get appointment with EP to reestablish in Dr. Jackalyn Lombard absence, but he decided to just f/u in afib clinic in one year, sooner if issues. Dr. Rayann Heman in last note m,mentioned repeating echo but he states no problems and would not want one at this time   I wrote a letter for [employer] stating he has no restrictions with driving with past afib issues  Butch Penny C. Nate Common, [facility] [address] [place], [place] [address] [phone]

## 2022-08-08 ENCOUNTER — Encounter (HOSPITAL_COMMUNITY): Payer: Self-pay | Admitting: *Deleted

## 2022-08-18 IMAGING — CT CT HEAD W/O CM
4 series · 16 of 47 positions shown, 18 images · non-contrast
Comparison: CT head 07/17/2011.

CLINICAL DATA: Headache.

EXAM:
CT HEAD WITHOUT CONTRAST
TECHNIQUE: Contiguous axial images were obtained from the base of the skull
through the vertex without intravenous contrast.

[Series 3: head bone · axial · 0.44mm/px · z∈[-92,-60]mm · 3 of 79 slices shown]
[im 8/79  bone]
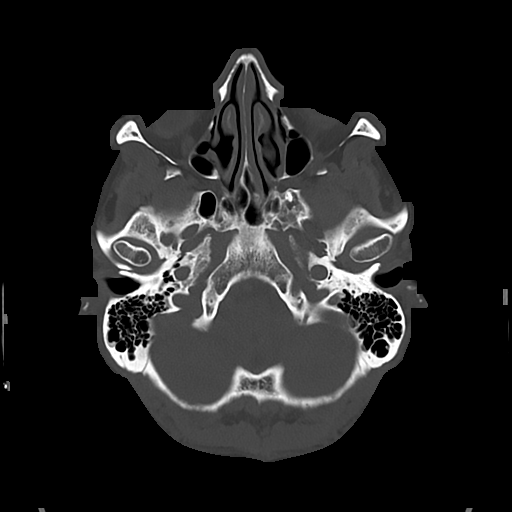
[im 16/79  bone]
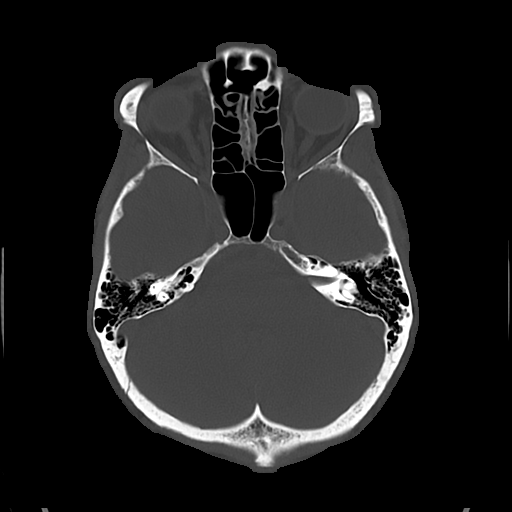
[im 24/79  bone]
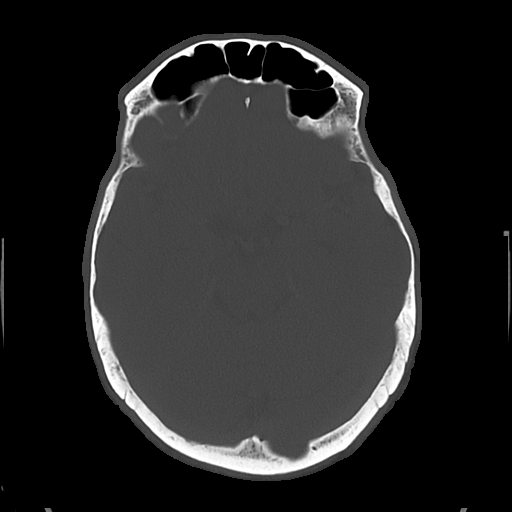

[Series 4: head wo · axial · 0.44mm/px · z∈[-91,+29]mm · 7 of 32 slices shown, 9 images]
[im 4/32  brain]
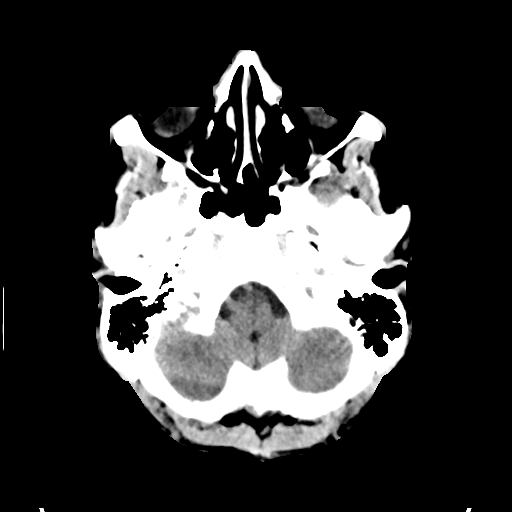
[im 4/32  bone]
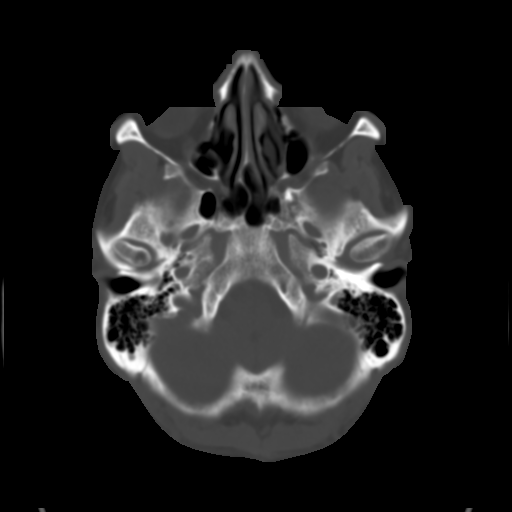
[im 8/32  brain]
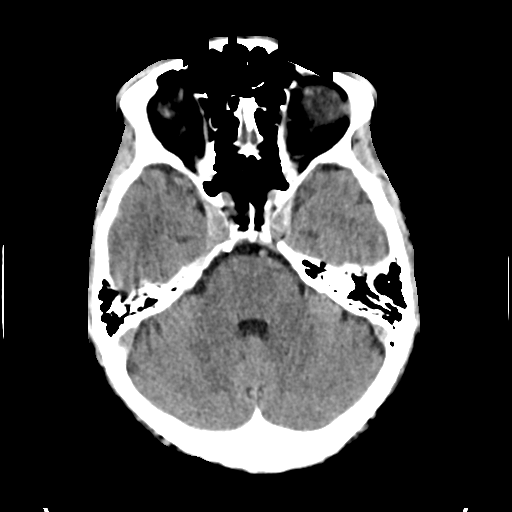
[im 12/32  brain]
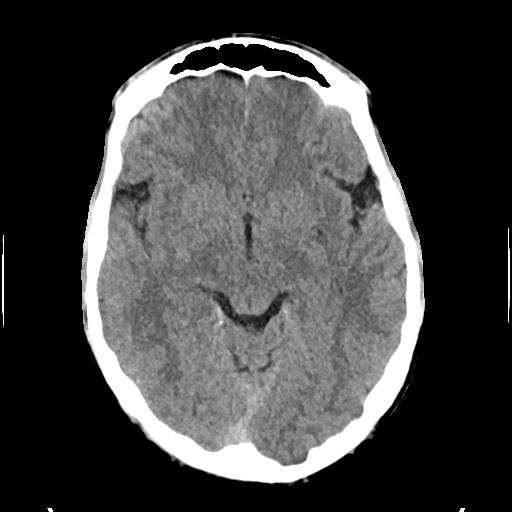
[im 16/32  brain]
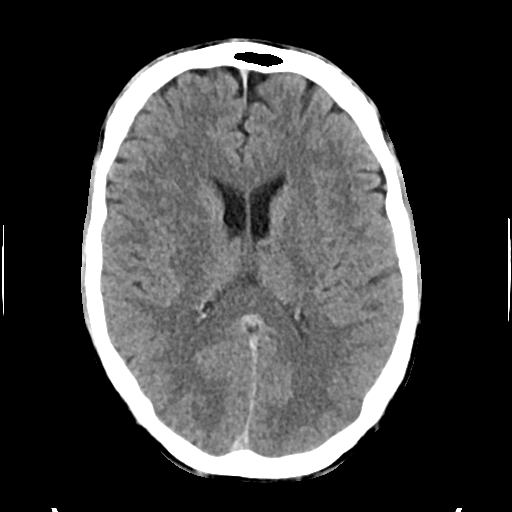
[im 20/32  brain]
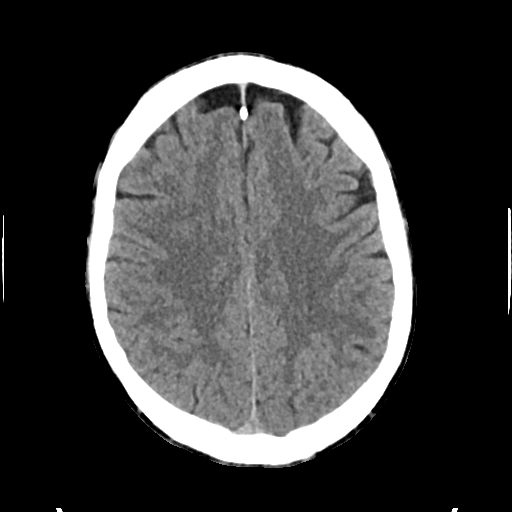
[im 20/32  bone]
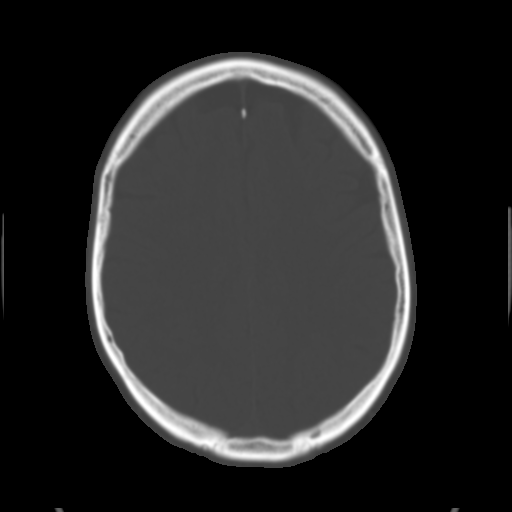
[im 24/32  brain]
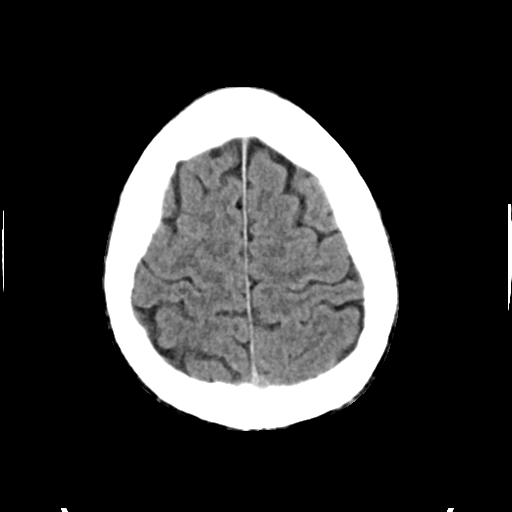
[im 28/32  brain]
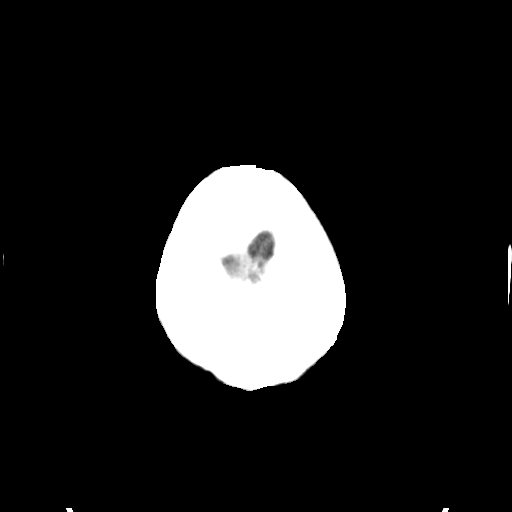

[Series 5: cor soft · coronal · 0.31mm/px · 3 of 69 slices shown]
[im 23/69  brain]
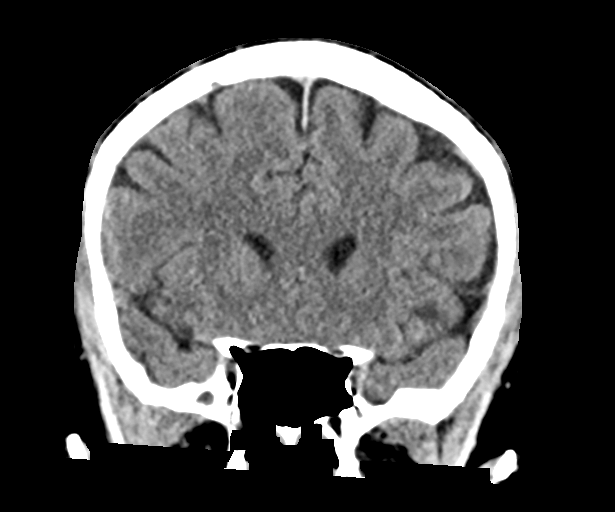
[im 31/69  brain]
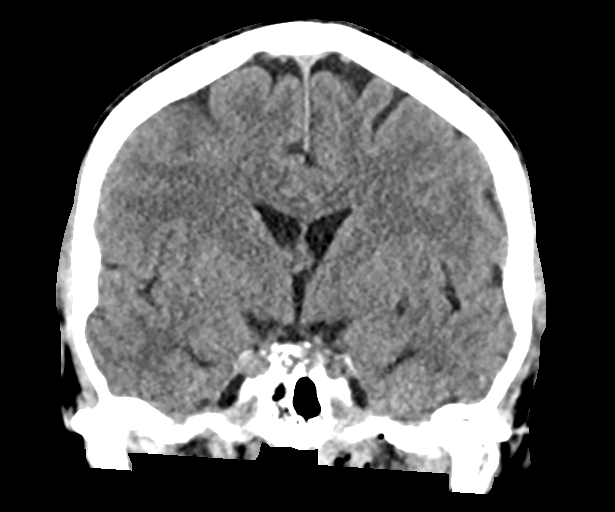
[im 38/69  brain]
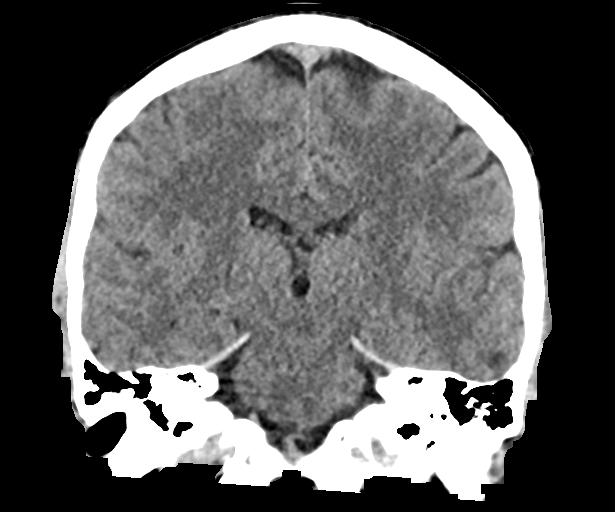

[Series 6: sag soft · sagittal · 0.31mm/px · 3 of 64 slices shown]
[im 22/64  brain]
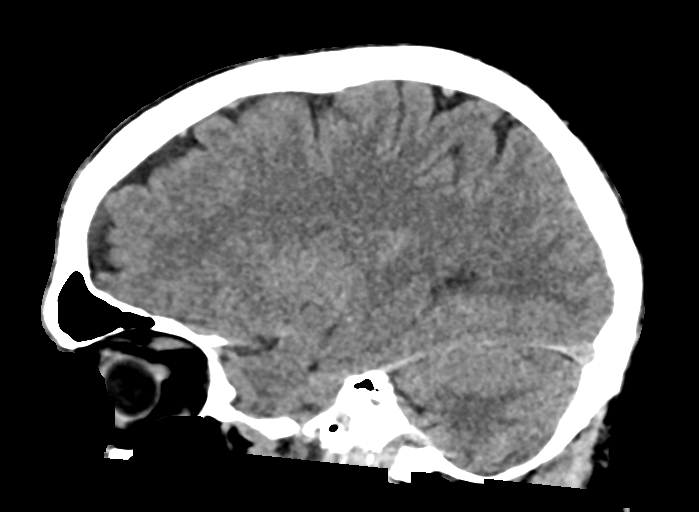
[im 32/64  brain]
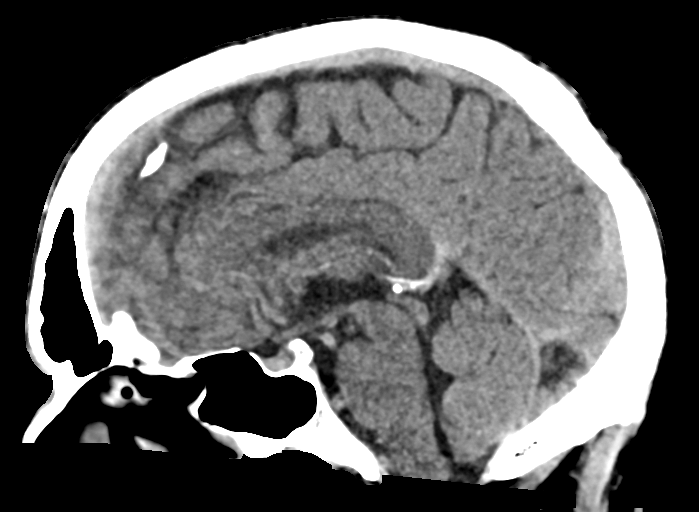
[im 43/64  brain]
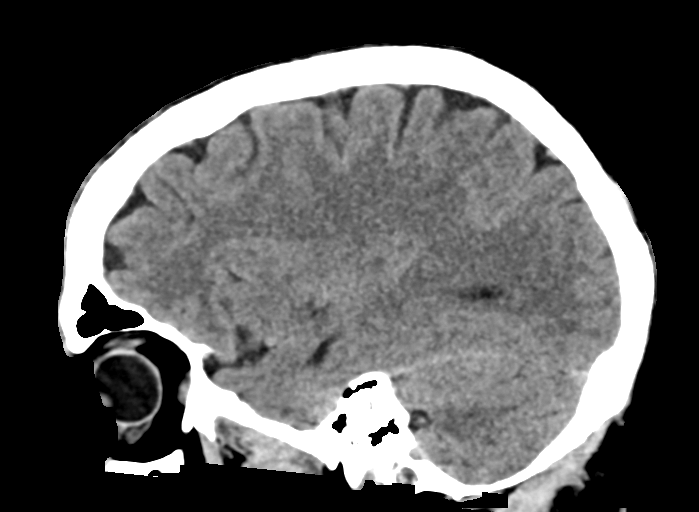

[16 of 47 positions shown; findings below may reference images not displayed]

FINDINGS: Brain: No evidence of acute infarction, hemorrhage, hydrocephalus,
extra-axial collection or mass lesion/mass effect.

Vascular: No hyperdense vessel or unexpected calcification.

Skull: Normal. Negative for fracture or focal lesion.

Sinuses/Orbits: No acute finding.

Other: None.
IMPRESSION: No acute intracranial abnormality.

## 2023-05-07 ENCOUNTER — Ambulatory Visit (HOSPITAL_COMMUNITY)
Admission: RE | Admit: 2023-05-07 | Discharge: 2023-05-07 | Disposition: A | Discharge status: Home / Self Care | Payer: Managed Care, Other (non HMO) | Source: Ambulatory Visit | Attending: Internal Medicine | Admitting: Internal Medicine

## 2023-05-07 VITALS — BP 122/80 | HR 56 | Ht 70.0 in | Wt 185.8 lb

## 2023-05-07 DIAGNOSIS — I4819 Other persistent atrial fibrillation: Secondary | ICD-10-CM | POA: Diagnosis present

## 2023-05-07 DIAGNOSIS — I480 Paroxysmal atrial fibrillation: Secondary | ICD-10-CM

## 2023-05-07 DIAGNOSIS — I4891 Unspecified atrial fibrillation: Secondary | ICD-10-CM

## 2023-05-07 NOTE — Progress Notes (Signed)
Primary Care Physician: Ileana Ladd, MD (Inactive) Referring Physician: prior pt of Dr. Hassell Halim is a 57 y.o. male with a h/o afib ablation in 2014 that is in afib clinic for yearly f/u. He states no afib, feels well. No complaints. He is not on anticoagulation due to CHA2DS2VASc  score of 0.    On follow up 05/07/23, he is currently in NSR. He has had no episodes of Afib since last office visit. His last episode of Afib was probably in ~2019 and seems to be triggered secondary to acute trauma (physical injury). He is very active and is an avid mountain bicyclist.   Today, he denies symptoms of palpitations, chest pain, shortness of breath, orthopnea, PND, lower extremity edema, dizziness, presyncope, syncope, or neurologic sequela. The patient is tolerating medications without difficulties and is otherwise without complaint today.   Past Medical History:  Diagnosis Date   GERD (gastroesophageal reflux disease)    IBS (irritable bowel syndrome)    Mild tricuspid regurgitation    Persistent atrial fibrillation [facility])    Past Surgical History:  Procedure Laterality Date   ANKLE FRACTURE SURGERY Right 1998   APPENDECTOMY  11/08/2015   ATRIAL FIBRILLATION ABLATION N/A 07/07/2012   Procedure: ATRIAL FIBRILLATION ABLATION;  Surgeon: Hillis Range, MD;  Location: [facility] CATH LAB;  Service: Cardiovascular;  Laterality: N/A;   CARDIAC CATHETERIZATION  01/21/2011   CARDIOVERSION  05/2012   "unsuccessful"   LAPAROSCOPIC APPENDECTOMY N/A 11/08/2015   Procedure: APPENDECTOMY LAPAROSCOPIC;  Surgeon: Jimmye Norman, MD;  Location: [facility] OR;  Service: General;  Laterality: N/A;   TEE WITHOUT CARDIOVERSION  07/07/2012   Procedure: TRANSESOPHAGEAL ECHOCARDIOGRAM (TEE);  Surgeon: Dolores Patty, MD;  Location: [facility] ENDOSCOPY;  Service: Cardiovascular;  Laterality: N/A;   TIBIA FRACTURE SURGERY Right 1998    Current Outpatient Medications  Medication Sig Dispense Refill   Ibuprofen (ADVIL PO)  Take 2 tablets by mouth as needed.     IRON, FERROUS SULFATE, PO Take 1 tablet by mouth every morning.     Multiple Vitamins-Minerals (MULTIVITAMIN ADULTS PO) Take 1 tablet by mouth every morning.     omeprazole (PRILOSEC) 40 MG capsule Take 40 mg by mouth daily.     vitamin B-12 (CYANOCOBALAMIN) 500 MCG tablet Take 500 mcg by mouth daily.     No current facility-administered medications for this encounter.    No Known Allergies  ROS- All systems are reviewed and negative except as per the HPI above  Physical Exam: Vitals:   05/07/23 1448  BP: 122/80  Pulse: (!) 56  Weight: 84.3 kg  Height: 5\' 10"  (1.778 m)    Wt Readings from Last 3 Encounters:  05/07/23 84.3 kg  05/07/22 89.9 kg  05/11/21 87.1 kg    Labs: Lab Results  Component Value Date   NA 138 03/08/2018   K 3.6 03/08/2018   CL 102 03/08/2018   CO2 26 03/08/2018   GLUCOSE 124 (H) 03/08/2018   BUN 12 03/08/2018   CREATININE 0.89 03/08/2018   CALCIUM 9.4 03/08/2018   MG 1.9 04/19/2011   No results found for: "INR" No results found for: "CHOL", "HDL", "LDLCALC", "TRIG"  GEN- The patient is well appearing, alert and oriented x 3 today.   Neck - no JVD or carotid bruit noted Lungs- Clear to ausculation bilaterally, normal work of breathing Heart- Regular rate and rhythm, no murmurs, rubs or gallops, PMI not laterally displaced Extremities- no clubbing, cyanosis, or edema Skin -  no rash or ecchymosis noted   EKG- Vent. rate 56 BPM PR interval 162 ms QRS duration 86 ms QT/QTcB 410/395 ms P-R-T axes 88 86 74 Sinus bradycardia Minimal voltage criteria for LVH, may be normal variant ( Sokolow-Lyon ) Early repolarization Borderline ECG When compared with ECG of 07-May-2022 15:36, PREVIOUS ECG IS PRESENT  ECHO 04/30/2018: Study Conclusions   - Left ventricle: The cavity size was normal. There was mild    concentric hypertrophy. Systolic function was normal. The    estimated ejection fraction was in the  range of 55% to 60%. Wall    motion was normal; there were no regional wall motion    abnormalities. Left ventricular diastolic function parameters    were normal.  - Aortic valve: Trileaflet; normal thickness, mildly calcified    leaflets.  - Mitral valve: There was trivial regurgitation.  - Left atrium: The atrium was mildly dilated.  - Right ventricle: The cavity size was mildly dilated. Wall    thickness was normal.  - Right atrium: The atrium was mildly dilated.  - Atrial septum: There was increased thickness of the septum,    consistent with lipomatous hypertrophy.  - Pulmonic valve: There was trivial regurgitation.    Assessment and Plan:  1. Afib  He has not appreciated any afib since cardioversion in 2019 He is not on any daily meds for afib and has very low burden overall.  CHA2DS2VASc score is 0 so no need for anticoagulation per guidelines  He is maintaining NSR.   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  Follow up in 1 year Afib clinic.    Justin Mend, PA-C [facility] [address] [place], [place] [address] [phone]

## 2023-07-20 ENCOUNTER — Encounter (HOSPITAL_COMMUNITY): Payer: Self-pay

## 2023-07-20 ENCOUNTER — Ambulatory Visit (HOSPITAL_COMMUNITY)
Admission: EM | Admit: 2023-07-20 | Discharge: 2023-07-20 | Disposition: A | Discharge status: Home / Self Care | Payer: BC Managed Care – PPO | Attending: Emergency Medicine | Admitting: Emergency Medicine

## 2023-07-20 DIAGNOSIS — H66003 Acute suppurative otitis media without spontaneous rupture of ear drum, bilateral: Secondary | ICD-10-CM | POA: Diagnosis not present

## 2023-07-20 MED ORDER — AMOXICILLIN-POT CLAVULANATE 875-125 MG PO TABS: 1.0000 | ORAL_TABLET | Freq: Two times a day (BID) | ORAL | 0 refills | Status: DC

## 2023-07-20 NOTE — ED Triage Notes (Signed)
Pt reports he has some muscle aches, chest congestion that started today

## 2023-07-20 NOTE — Discharge Instructions (Signed)
Start taking Augmentin twice daily for for 7 days for ear infection.  Alternate between Tylenol and ibuprofen as needed for body aches and fever.  I recommend taking Mucinex to help with cough and congestion.  Return here as needed.

## 2023-07-20 NOTE — ED Provider Notes (Signed)
[facility]    CSN: [id number] Arrival date & time: 07/20/23  1636      History   Chief Complaint No chief complaint on file.   HPI Chad Braun is a 58 y.o. male.   Patient presents with bodyaches, congestion, and fever that began today.  Denies abdominal pain, nausea, vomiting, and diarrhea.     Past Medical History:  Diagnosis Date   GERD (gastroesophageal reflux disease)    IBS (irritable bowel syndrome)    Mild tricuspid regurgitation    Persistent atrial fibrillation [facility])     Patient Active Problem List   Diagnosis Date Noted   Acute appendicitis 11/08/2015   Paresthesia and pain of left extremity 08/02/2015   Atrial fibrillation (HCC) 02/19/2011   Asymptomatic LV dysfunction 02/19/2011   Chronic anticoagulation 02/19/2011    Past Surgical History:  Procedure Laterality Date   ANKLE FRACTURE SURGERY Right 1998   APPENDECTOMY  11/08/2015   ATRIAL FIBRILLATION ABLATION N/A 07/07/2012   Procedure: ATRIAL FIBRILLATION ABLATION;  Surgeon: Hillis Range, MD;  Location: [facility] CATH LAB;  Service: Cardiovascular;  Laterality: N/A;   CARDIAC CATHETERIZATION  01/21/2011   CARDIOVERSION  05/2012   "unsuccessful"   LAPAROSCOPIC APPENDECTOMY N/A 11/08/2015   Procedure: APPENDECTOMY LAPAROSCOPIC;  Surgeon: Jimmye Norman, MD;  Location: [facility] OR;  Service: General;  Laterality: N/A;   TEE WITHOUT CARDIOVERSION  07/07/2012   Procedure: TRANSESOPHAGEAL ECHOCARDIOGRAM (TEE);  Surgeon: Dolores Patty, MD;  Location: [facility] ENDOSCOPY;  Service: Cardiovascular;  Laterality: N/A;   TIBIA FRACTURE SURGERY Right 1998       Home Medications    Prior to Admission medications   Medication Sig Start Date End Date Taking? Authorizing Provider  amoxicillin-clavulanate (AUGMENTIN) 875-125 MG tablet Take 1 tablet by mouth every 12 (twelve) hours. 07/20/23  Yes Taevyn Hausen A, NP  IRON, FERROUS SULFATE, PO Take 1 tablet by mouth every morning.    [provider]   Multiple Vitamins-Minerals (MULTIVITAMIN ADULTS PO) Take 1 tablet by mouth every morning.    [provider]  omeprazole (PRILOSEC) 40 MG capsule Take 40 mg by mouth daily. 05/04/22   [provider]  vitamin B-12 (CYANOCOBALAMIN) 500 MCG tablet Take 500 mcg by mouth daily.    [provider]    Family History Family History  Problem Relation Age of Onset   Breast cancer Mother    Lung cancer Mother    Colon cancer Father     Social History Social History   Tobacco Use   Smoking status: Never   Smokeless tobacco: Never  Vaping Use   Vaping status: Never Used  Substance Use Topics   Alcohol use: Yes    Alcohol/week: 2.0 standard drinks of alcohol    Types: 1 Cans of beer, 1 Glasses of wine per week   Drug use: No     Allergies   Patient has no known allergies.   Review of Systems Review of Systems  Constitutional:  Positive for chills and fever. Negative for fatigue.  HENT:  Positive for congestion.   Respiratory:  Positive for cough. Negative for shortness of breath.   Cardiovascular:  Negative for chest pain.     Physical Exam Triage Vital Signs ED Triage Vitals  Encounter Vitals Group     BP 07/20/23 1754 118/66     Systolic BP Percentile --      Diastolic BP Percentile --      Pulse Rate 07/20/23 1754 82  Resp 07/20/23 1754 18     Temp 07/20/23 1754 98.8 F (37.1 C)     Temp Source 07/20/23 1754 Oral     SpO2 07/20/23 1754 94 %     Weight --      Height --      Head Circumference --      Peak Flow --      Pain Score 07/20/23 1753 8     Pain Loc --      Pain Education --      Exclude from Growth Chart --    No data found.  Updated Vital Signs BP 118/66 (BP Location: Left Arm)   Pulse 82   Temp 98.8 F (37.1 C) (Oral)   Resp 18   SpO2 94%   Visual Acuity Right Eye Distance:   Left Eye Distance:   Bilateral Distance:    Right Eye Near:   Left Eye Near:    Bilateral Near:     Physical Exam Vitals and  nursing note reviewed.  Constitutional:      General: He is awake. He is not in acute distress.    Appearance: Normal appearance. He is well-developed and well-groomed. He is not ill-appearing.  HENT:     Right Ear: Tympanic membrane is erythematous and bulging.     Left Ear: Tympanic membrane is erythematous and bulging.     Nose: Congestion and rhinorrhea present.     Mouth/Throat:     Mouth: Mucous membranes are moist.     Pharynx: Posterior oropharyngeal erythema and postnasal drip present.     Tonsils: No tonsillar exudate.  Cardiovascular:     Rate and Rhythm: Normal rate and regular rhythm.  Pulmonary:     Effort: Pulmonary effort is normal.     Breath sounds: Normal breath sounds.  Neurological:     Mental Status: He is alert.  Psychiatric:        Behavior: Behavior is cooperative.      UC Treatments / Results  Labs (all labs ordered are listed, but only abnormal results are displayed) Labs Reviewed - No data to display  EKG   Radiology No results found.  Procedures Procedures (including critical care time)  Medications Ordered in UC Medications - No data to display  Initial Impression / Assessment and Plan / UC Course  I have reviewed the triage vital signs and the nursing notes.  Pertinent labs & imaging results that were available during my care of the patient were reviewed by me and considered in my medical decision making (see chart for details).     Patient presented with bodyaches, congestion, and fever that began today.  Upon assessment congestion and rhinorrhea are present.  Bilateral TMs erythematous and bulging.  Lungs clear bilaterally on auscultation.  Prescribed Augmentin for bilateral otitis media.  Discussed over-the-counter medication for symptoms.  Discussed return precautions. Final Clinical Impressions(s) / UC Diagnoses   Final diagnoses:  Non-recurrent acute suppurative otitis media of both ears without spontaneous rupture of tympanic  membranes     Discharge Instructions      Start taking Augmentin twice daily for for 7 days for ear infection.  Alternate between Tylenol and ibuprofen as needed for body aches and fever.  I recommend taking Mucinex to help with cough and congestion.  Return here as needed.    ED Prescriptions     Medication Sig Dispense Auth. Provider   amoxicillin-clavulanate (AUGMENTIN) 875-125 MG tablet Take 1 tablet by mouth every 12 (  twelve) hours. 14 tablet Wynonia Lawman A, NP      PDMP not reviewed this encounter.   Wynonia Lawman A, NP 07/20/23 1904

## 2023-08-20 DIAGNOSIS — L820 Inflamed seborrheic keratosis: Secondary | ICD-10-CM | POA: Diagnosis not present

## 2023-08-20 DIAGNOSIS — D485 Neoplasm of uncertain behavior of skin: Secondary | ICD-10-CM | POA: Diagnosis not present

## 2023-08-30 DIAGNOSIS — S56912A Strain of unspecified muscles, fascia and tendons at forearm level, left arm, initial encounter: Secondary | ICD-10-CM | POA: Diagnosis not present

## 2023-09-16 DIAGNOSIS — M19022 Primary osteoarthritis, left elbow: Secondary | ICD-10-CM | POA: Diagnosis not present

## 2023-09-25 DIAGNOSIS — M19022 Primary osteoarthritis, left elbow: Secondary | ICD-10-CM | POA: Diagnosis not present

## 2023-09-30 DIAGNOSIS — M19022 Primary osteoarthritis, left elbow: Secondary | ICD-10-CM | POA: Diagnosis not present

## 2023-09-30 DIAGNOSIS — M25622 Stiffness of left elbow, not elsewhere classified: Secondary | ICD-10-CM | POA: Diagnosis not present

## 2023-11-09 DIAGNOSIS — S93491A Sprain of other ligament of right ankle, initial encounter: Secondary | ICD-10-CM | POA: Diagnosis not present

## 2023-12-11 DIAGNOSIS — H04123 Dry eye syndrome of bilateral lacrimal glands: Secondary | ICD-10-CM | POA: Diagnosis not present

## 2024-01-02 ENCOUNTER — Ambulatory Visit (HOSPITAL_COMMUNITY)
Admission: EM | Admit: 2024-01-02 | Discharge: 2024-01-02 | Disposition: A | Discharge status: Home / Self Care | Attending: Emergency Medicine | Admitting: Emergency Medicine

## 2024-01-02 ENCOUNTER — Encounter (HOSPITAL_COMMUNITY): Payer: Self-pay

## 2024-01-02 DIAGNOSIS — W57XXXA Bitten or stung by nonvenomous insect and other nonvenomous arthropods, initial encounter: Secondary | ICD-10-CM

## 2024-01-02 DIAGNOSIS — L03115 Cellulitis of right lower limb: Secondary | ICD-10-CM | POA: Diagnosis not present

## 2024-01-02 DIAGNOSIS — S80861A Insect bite (nonvenomous), right lower leg, initial encounter: Secondary | ICD-10-CM

## 2024-01-02 MED ORDER — DOXYCYCLINE HYCLATE 100 MG PO CAPS: 100.0000 mg | ORAL_CAPSULE | Freq: Two times a day (BID) | ORAL | 0 refills | Status: DC

## 2024-01-02 NOTE — ED Provider Notes (Signed)
 [facility]    CSN: [id number] Arrival date & time: 01/02/24  1311      History   Chief Complaint Chief Complaint  Patient presents with   Insect Bite    HPI Chad Braun is a 58 y.o. male.   Patient presents with concerns for insect bite to his right lower leg.  Patient states that he had an insect bite that occurred about 2 weeks ago.  Patient states he has not had any issues with this until last night when he began to notice some redness and rash surrounding the bite.    Patient is unsure of the exact bug that bit him.  Patient states he does not ever removing a tick from the area, and thinks it may have been a spider. Patient denies any swelling or drainage from the area.  Patient denies fever, bodies, chills, and weakness.  Patient denies taking medications or applying anything to this area.  The history is provided by the patient and medical records.    Past Medical History:  Diagnosis Date   GERD (gastroesophageal reflux disease)    IBS (irritable bowel syndrome)    Mild tricuspid regurgitation    Persistent atrial fibrillation [facility])     Patient Active Problem List   Diagnosis Date Noted   Acute appendicitis 11/08/2015   Paresthesia and pain of left extremity 08/02/2015   Atrial fibrillation (HCC) 02/19/2011   Asymptomatic LV dysfunction 02/19/2011   Chronic anticoagulation 02/19/2011    Past Surgical History:  Procedure Laterality Date   ANKLE FRACTURE SURGERY Right 1998   APPENDECTOMY  11/08/2015   ATRIAL FIBRILLATION ABLATION N/A 07/07/2012   Procedure: ATRIAL FIBRILLATION ABLATION;  Surgeon: Lynwood Rakers, MD;  Location: [facility] CATH LAB;  Service: Cardiovascular;  Laterality: N/A;   CARDIAC CATHETERIZATION  01/21/2011   CARDIOVERSION  05/2012   unsuccessful   LAPAROSCOPIC APPENDECTOMY N/A 11/08/2015   Procedure: APPENDECTOMY LAPAROSCOPIC;  Surgeon: Lynwood Pina, MD;  Location: [facility] OR;  Service: General;  Laterality: N/A;   TEE WITHOUT  CARDIOVERSION  07/07/2012   Procedure: TRANSESOPHAGEAL ECHOCARDIOGRAM (TEE);  Surgeon: Toribio JONELLE Fuel, MD;  Location: [facility] ENDOSCOPY;  Service: Cardiovascular;  Laterality: N/A;   TIBIA FRACTURE SURGERY Right 1998       Home Medications    Prior to Admission medications   Medication Sig Start Date End Date Taking? Authorizing Provider  doxycycline  (VIBRAMYCIN ) 100 MG capsule Take 1 capsule (100 mg total) by mouth 2 (two) times daily. 01/02/24  Yes Johnie Flaming A, NP  Multiple Vitamins-Minerals (MULTIVITAMIN ADULTS PO) Take 1 tablet by mouth every morning.    [provider]  omeprazole (PRILOSEC) 40 MG capsule Take 40 mg by mouth daily. 05/04/22   [provider]    Family History Family History  Problem Relation Age of Onset   Breast cancer Mother    Lung cancer Mother    Colon cancer Father     Social History Social History   Tobacco Use   Smoking status: Never   Smokeless tobacco: Never  Vaping Use   Vaping status: Never Used  Substance Use Topics   Alcohol use: Yes    Alcohol/week: 2.0 standard drinks of alcohol    Types: 1 Cans of beer, 1 Glasses of wine per week   Drug use: No     Allergies   Patient has no known allergies.   Review of Systems Review of Systems  Per HPI  Physical Exam Triage Vital Signs ED Triage Vitals [  01/02/24 1341]  Encounter Vitals Group     BP 118/72     Girls Systolic BP Percentile      Girls Diastolic BP Percentile      Boys Systolic BP Percentile      Boys Diastolic BP Percentile      Pulse Rate 68     Resp 14     Temp 98.1 F (36.7 C)     Temp Source Oral     SpO2 93 %     Weight      Height      Head Circumference      Peak Flow      Pain Score 2     Pain Loc      Pain Education      Exclude from Growth Chart    No data found.  Updated Vital Signs BP 118/72 (BP Location: Right Arm)   Pulse 68   Temp 98.1 F (36.7 C) (Oral)   Resp 14   SpO2 93%   Visual Acuity Right Eye Distance:    Left Eye Distance:   Bilateral Distance:    Right Eye Near:   Left Eye Near:    Bilateral Near:     Physical Exam Vitals and nursing note reviewed.  Constitutional:      General: He is awake. He is not in acute distress.    Appearance: Normal appearance. He is well-developed and well-groomed. He is not ill-appearing.  Skin:    General: Skin is warm and dry.     Findings: Erythema, rash and wound present.     Comments: Circular area of erythema and rash with bull's-eye-like appearance around bug bite noted to the right upper medial aspect of the lower leg.  Area measures approximately 5 to 6 cm in diameter.  Neurological:     Mental Status: He is alert.  Psychiatric:        Behavior: Behavior is cooperative.      UC Treatments / Results  Labs (all labs ordered are listed, but only abnormal results are displayed) Labs Reviewed - No data to display  EKG   Radiology No results found.  Procedures Procedures (including critical care time)  Medications Ordered in UC Medications - No data to display  Initial Impression / Assessment and Plan / UC Course  I have reviewed the triage vital signs and the nursing notes.  Pertinent labs & imaging results that were available during my care of the patient were reviewed by me and considered in my medical decision making (see chart for details).     Patient is overall well-appearing.  Vitals are stable.  Upon assessment there is a circular area of erythema and rash with bull's-eye-like appearance around bug bite noted to the right upper medial aspect of the lower leg.  The area currently measures approximately 5 to 6 cm in diameter.  Prescribed doxycycline  for cellulitis coverage.  Discussed follow-up, return, and strict ER precautions. Final Clinical Impressions(s) / UC Diagnoses   Final diagnoses:  Insect bite of right lower leg, initial encounter  Cellulitis of right lower extremity     Discharge Instructions      Start  taking doxycycline  twice daily for 10 days for cellulitis. In the next 24 hours if you notice increased bleeding or redness, swelling, or purulent drainage from the area, develop fever, bodies, or chills please go to the emergency department for further evaluation. Otherwise keep an eye on the area and you can return here  for any persistent problems related to this. Follow-up with your primary care provider as needed.    ED Prescriptions     Medication Sig Dispense Auth. Provider   doxycycline  (VIBRAMYCIN ) 100 MG capsule Take 1 capsule (100 mg total) by mouth 2 (two) times daily. 20 capsule Johnie Flaming A, NP      PDMP not reviewed this encounter.   Johnie Flaming A, NP 01/02/24 [phone]

## 2024-01-02 NOTE — ED Triage Notes (Signed)
 Patient c/o insect bite to the right lower leg below the knee x 2 weeks. Area is red. Patient states has worsened a lot in the past 24 hours.  Patient denies using any medication for his symptoms.

## 2024-01-02 NOTE — Discharge Instructions (Addendum)
 Start taking doxycycline  twice daily for 10 days for cellulitis. In the next 24 hours if you notice increased bleeding or redness, swelling, or purulent drainage from the area, develop fever, bodies, or chills please go to the emergency department for further evaluation. Otherwise keep an eye on the area and you can return here for any persistent problems related to this. Follow-up with your primary care provider as needed.

## 2024-01-27 DIAGNOSIS — S93491D Sprain of other ligament of right ankle, subsequent encounter: Secondary | ICD-10-CM | POA: Diagnosis not present

## 2024-02-11 DIAGNOSIS — H04123 Dry eye syndrome of bilateral lacrimal glands: Secondary | ICD-10-CM | POA: Diagnosis not present

## 2024-02-11 DIAGNOSIS — H18513 Endothelial corneal dystrophy, bilateral: Secondary | ICD-10-CM | POA: Diagnosis not present

## 2024-02-11 DIAGNOSIS — D3131 Benign neoplasm of right choroid: Secondary | ICD-10-CM | POA: Diagnosis not present

## 2024-05-06 ENCOUNTER — Ambulatory Visit (HOSPITAL_COMMUNITY)
Admission: RE | Admit: 2024-05-06 | Discharge: 2024-05-06 | Disposition: A | Discharge status: Home / Self Care | Source: Ambulatory Visit | Attending: Internal Medicine | Admitting: Internal Medicine

## 2024-05-06 ENCOUNTER — Encounter (HOSPITAL_COMMUNITY): Payer: Self-pay | Admitting: *Deleted

## 2024-05-06 ENCOUNTER — Encounter (HOSPITAL_COMMUNITY): Payer: Self-pay | Admitting: Internal Medicine

## 2024-05-06 VITALS — BP 124/90 | HR 63 | Ht 70.0 in | Wt 194.6 lb

## 2024-05-06 DIAGNOSIS — I480 Paroxysmal atrial fibrillation: Secondary | ICD-10-CM | POA: Diagnosis not present

## 2024-05-06 NOTE — Progress Notes (Addendum)
 Primary Care Physician: Patient, No Pcp Per Referring Physician: prior pt of Dr. Kelsie Roselyn Chad Braun is a 58 y.o. male with a h/o afib ablation in 2014 that is in afib clinic for yearly f/u. He states no afib, feels well. No complaints. He is not on anticoagulation due to CHA2DS2VASc score of 0. His last episode of Afib was probably in ~2019 and seems to be triggered secondary to acute trauma (physical injury). He is very active and is an avid mountain bicyclist.   On follow up 05/06/24, patient is currently in NSR. He has had overall no Afib burden since last office visit. He is not on any cardiac medications at this time. He is an avid cyclist. 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  Today, he denies symptoms of palpitations, chest pain, shortness of breath, orthopnea, PND, lower extremity edema, dizziness, presyncope, syncope, or neurologic sequela. The patient is tolerating medications without difficulties and is otherwise without complaint today.   Past Medical History:  Diagnosis Date   GERD (gastroesophageal reflux disease)    IBS (irritable bowel syndrome)    Mild tricuspid regurgitation    Persistent atrial fibrillation [facility])    Past Surgical History:  Procedure Laterality Date   ANKLE FRACTURE SURGERY Right 1998   APPENDECTOMY  11/08/2015   ATRIAL FIBRILLATION ABLATION N/A 07/07/2012   Procedure: ATRIAL FIBRILLATION ABLATION;  Surgeon: Lynwood Kelsie, MD;  Location: [facility] CATH LAB;  Service: Cardiovascular;  Laterality: N/A;   CARDIAC CATHETERIZATION  01/21/2011   CARDIOVERSION  05/2012   unsuccessful   LAPAROSCOPIC APPENDECTOMY N/A 11/08/2015   Procedure: APPENDECTOMY LAPAROSCOPIC;  Surgeon: Lynwood Pina, MD;  Location: [facility] OR;  Service: General;  Laterality: N/A;   TEE WITHOUT CARDIOVERSION  07/07/2012   Procedure: TRANSESOPHAGEAL ECHOCARDIOGRAM (TEE);  Surgeon: Toribio JONELLE Fuel, MD;  Location: [facility] ENDOSCOPY;   Service: Cardiovascular;  Laterality: N/A;   TIBIA FRACTURE SURGERY Right 1998    Current Outpatient Medications  Medication Sig Dispense Refill   Multiple Vitamins-Minerals (MULTIVITAMIN ADULTS PO) Take 1 tablet by mouth every morning.     omeprazole (PRILOSEC) 40 MG capsule Take 40 mg by mouth daily.     No current facility-administered medications for this encounter.    No Known Allergies  ROS- All systems are reviewed and negative except as per the HPI above  Physical Exam: Vitals:   05/06/24 1329  BP: (!) 124/90  Pulse: 63  Weight: 88.3 kg  Height: 5' 10 (1.778 m)     Wt Readings from Last 3 Encounters:  05/06/24 88.3 kg  05/07/23 84.3 kg  05/07/22 89.9 kg    Labs: Lab Results  Component Value Date   NA 138 03/08/2018   K 3.6 03/08/2018   CL 102 03/08/2018   CO2 26 03/08/2018   GLUCOSE 124 (H) 03/08/2018   BUN 12 03/08/2018   CREATININE 0.89 03/08/2018   CALCIUM 9.4 03/08/2018   MG 1.9 04/19/2011   No results found for: INR No results found for: CHOL, HDL, LDLCALC, TRIG  GEN- The patient is well appearing, alert and oriented x 3 today.   Neck - no JVD or carotid bruit noted Lungs- Clear to ausculation bilaterally, normal work of breathing Heart- Regular rate and rhythm, no murmurs, rubs or gallops, PMI not laterally displaced Extremities- no clubbing, cyanosis, or edema Skin - no rash or ecchymosis noted   EKG- Vent. rate 63 BPM PR interval 162  ms QRS duration 86 ms QT/QTcB 392/401 ms P-R-T axes 80 79 63 Normal sinus rhythm Minimal voltage criteria for LVH, may be normal variant ( Sokolow-Lyon ) Early repolarization Borderline ECG When compared with ECG of 07-May-2023 15:07, No significant change was found  ECHO 04/30/2018: Study Conclusions   - Left ventricle: The cavity size was normal. There was mild    concentric hypertrophy. Systolic function was normal. The    estimated ejection fraction was in the range of 55% to 60%.  Wall    motion was normal; there were no regional wall motion    abnormalities. Left ventricular diastolic function parameters    were normal.  - Aortic valve: Trileaflet; normal thickness, mildly calcified    leaflets.  - Mitral valve: There was trivial regurgitation.  - Left atrium: The atrium was mildly dilated.  - Right ventricle: The cavity size was mildly dilated. Wall    thickness was normal.  - Right atrium: The atrium was mildly dilated.  - Atrial septum: There was increased thickness of the septum,    consistent with lipomatous hypertrophy.  - Pulmonic valve: There was trivial regurgitation.    Assessment and Plan:  1. Afib  He has not appreciated any afib since cardioversion in 2019.  He is not on any cardiac medications. He has a risk score of zero and is not on any anticoagulation.    Patient appears to be maintaining SR with very low burden overall.   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 He is clear from a cardiac standpoint.   Follow up in 1 year Afib clinic.    Chad Heinrich, PA-C Afib [facility] [address] [place], [place] [address] [phone]

## 2024-06-26 ENCOUNTER — Telehealth

## 2024-07-13 ENCOUNTER — Ambulatory Visit
Admission: EM | Admit: 2024-07-13 | Discharge: 2024-07-13 | Disposition: A | Discharge status: Home / Self Care | Attending: Family Medicine | Admitting: Family Medicine

## 2024-07-13 DIAGNOSIS — R197 Diarrhea, unspecified: Secondary | ICD-10-CM | POA: Diagnosis present

## 2024-07-13 DIAGNOSIS — A071 Giardiasis [lambliasis]: Secondary | ICD-10-CM | POA: Insufficient documentation

## 2024-07-13 DIAGNOSIS — K529 Noninfective gastroenteritis and colitis, unspecified: Secondary | ICD-10-CM | POA: Insufficient documentation

## 2024-07-13 LAB — C DIFFICILE QUICK SCREEN W PCR REFLEX
C Diff antigen: NEGATIVE
C Diff interpretation: NOT DETECTED
C Diff toxin: NEGATIVE

## 2024-07-13 NOTE — ED Triage Notes (Signed)
 Pt states that he has some diarrhea, fatigue, abdominal cramping and nausea. X3 weeks   Pt states that he has been taking Imodium.

## 2024-07-13 NOTE — ED Provider Notes (Signed)
 " [facility]  Note:  This document was prepared using Conservation officer, historic buildings and may include unintentional dictation errors.  MRN: [id number] DOB: May 18, 1966  Subjective:   Chad Braun is a 59 y.o. male presenting for 3 week history of persistent watery loose stools having ~4-5 bowel movements daily.  Has used loperamide with intermittent relief.  No fever, bloody stools, recent antibiotic use, hospitalizations or long distance travel.  Has not eaten raw foods, drank unfiltered water.  Has a history of eosinophilic esophagitis. Has a history of IBS.   Current Outpatient Medications  Medication Instructions   Multiple Vitamins-Minerals (MULTIVITAMIN ADULTS PO) 1 tablet, Every morning   omeprazole (PRILOSEC) 40 mg, Daily    Allergies[1]  Past Medical History:  Diagnosis Date   GERD (gastroesophageal reflux disease)    IBS (irritable bowel syndrome)    Mild tricuspid regurgitation    Persistent atrial fibrillation [facility])      Past Surgical History:  Procedure Laterality Date   ANKLE FRACTURE SURGERY Right 1998   APPENDECTOMY  11/08/2015   ATRIAL FIBRILLATION ABLATION N/A 07/07/2012   Procedure: ATRIAL FIBRILLATION ABLATION;  Surgeon: Lynwood Rakers, MD;  Location: [facility] CATH LAB;  Service: Cardiovascular;  Laterality: N/A;   CARDIAC CATHETERIZATION  01/21/2011   CARDIOVERSION  05/2012   unsuccessful   LAPAROSCOPIC APPENDECTOMY N/A 11/08/2015   Procedure: APPENDECTOMY LAPAROSCOPIC;  Surgeon: Lynwood Pina, MD;  Location: [facility] OR;  Service: General;  Laterality: N/A;   TEE WITHOUT CARDIOVERSION  07/07/2012   Procedure: TRANSESOPHAGEAL ECHOCARDIOGRAM (TEE);  Surgeon: Toribio JONELLE Fuel, MD;  Location: [facility] ENDOSCOPY;  Service: Cardiovascular;  Laterality: N/A;   TIBIA FRACTURE SURGERY Right 1998    Family History  Problem Relation Age of Onset   Breast cancer Mother    Lung cancer Mother    Colon cancer Father     Social History   Occupational  History    Employer: [employer]    Comment: Engineer, structural  Tobacco Use   Smoking status: Never   Smokeless tobacco: Never   Tobacco comments:    Never smoked 05/06/24  Vaping Use   Vaping status: Never Used  Substance and Sexual Activity   Alcohol use: Yes    Alcohol/week: 2.0 standard drinks of alcohol    Types: 1 Cans of beer, 1 Glasses of wine per week   Drug use: No   Sexual activity: Yes     ROS   Objective:   Vitals: BP 104/65 (BP Location: Right Arm)   Pulse 68   Temp 98.4 F (36.9 C) (Oral)   Resp 18   Ht 5' 10 (1.778 m)   Wt 185 lb (83.9 kg)   SpO2 95%   BMI 26.54 kg/m   Physical Exam Constitutional:      General: He is not in acute distress.    Appearance: Normal appearance. He is well-developed and normal weight. He is not ill-appearing, toxic-appearing or diaphoretic.  HENT:     Head: Normocephalic and atraumatic.     Right Ear: External ear normal.     Left Ear: External ear normal.     Nose: Nose normal.     Mouth/Throat:     Pharynx: Oropharynx is clear.  Eyes:     General: No scleral icterus.       Right eye: No discharge.        Left eye: No discharge.     Extraocular Movements: Extraocular movements intact.  Cardiovascular:     Rate and Rhythm: Normal rate.  Pulmonary:     Effort: Pulmonary effort is normal.  Abdominal:     General: Bowel sounds are normal. There is no distension.     Palpations: Abdomen is soft. There is no mass.     Tenderness: There is no abdominal tenderness. There is no right CVA tenderness, left CVA tenderness, guarding or rebound.  Musculoskeletal:     Cervical back: Normal range of motion.  Neurological:     Mental Status: He is alert and oriented to person, place, and time.  Psychiatric:        Mood and Affect: Mood normal.        Behavior: Behavior normal.        Thought Content: Thought content normal.        Judgment: Judgment normal.     Assessment and Plan :   PDMP not  reviewed this encounter.  1. Colitis   2. Diarrhea, unspecified type     No signs of an acute abdomen.  Will pursue GI panel, C. difficile testing.  Recommend supportive care for colitis otherwise.  Offered prescription medications but patient declined.    [1] No Known Allergies    Christopher Savannah, PA-C 07/13/24 1731  "

## 2024-07-13 NOTE — Discharge Instructions (Addendum)
 Will let you know about your test results in 1-2 days. Make sure you push fluids drinking mostly water but mix it with Gatorade.  Try to eat light meals including soups, broths and soft foods, fruits. You can continue taking Imodium as it can help with diarrhea but use this carefully limiting it to 1-2 times per day only if you are having a lot of diarrhea.  Please return to the clinic if symptoms worsen or you start having severe abdominal pain not helped by taking Tylenol  or start having bloody stools or blood in the vomit.

## 2024-07-14 ENCOUNTER — Telehealth: Payer: Self-pay | Admitting: Emergency Medicine

## 2024-07-14 ENCOUNTER — Ambulatory Visit (HOSPITAL_COMMUNITY): Payer: Self-pay

## 2024-07-14 LAB — GASTROINTESTINAL PANEL BY PCR, STOOL (REPLACES STOOL CULTURE)

## 2024-07-14 MED ORDER — TINIDAZOLE 500 MG PO TABS: 2.0000 g | ORAL_TABLET | Freq: Once | ORAL | 0 refills | Status: AC

## 2024-07-14 NOTE — Telephone Encounter (Signed)
 Patient called regarding his lab results.  Per Ashlee Hermanns, PA-C pt does have a bacteria in his stool.  The antibiotic that was sent in to his pharmacy on file by [facility] should treat this.  Patient instructed to stay hydrated and follow up with his PCP.  Patient voices understanding.

## 2024-07-14 NOTE — Telephone Encounter (Signed)
 RX sent for positive giardiasis results on GI panel

## 2024-07-27 ENCOUNTER — Telehealth: Admitting: Emergency Medicine

## 2024-07-27 ENCOUNTER — Ambulatory Visit
Admission: EM | Admit: 2024-07-27 | Discharge: 2024-07-27 | Disposition: A | Discharge status: Home / Self Care | Attending: Family Medicine | Admitting: Family Medicine

## 2024-07-27 DIAGNOSIS — R52 Pain, unspecified: Secondary | ICD-10-CM

## 2024-07-27 DIAGNOSIS — R509 Fever, unspecified: Secondary | ICD-10-CM

## 2024-07-27 DIAGNOSIS — K5900 Constipation, unspecified: Secondary | ICD-10-CM | POA: Diagnosis not present

## 2024-07-27 DIAGNOSIS — R1011 Right upper quadrant pain: Secondary | ICD-10-CM

## 2024-07-27 DIAGNOSIS — R6889 Other general symptoms and signs: Secondary | ICD-10-CM

## 2024-07-27 DIAGNOSIS — B349 Viral infection, unspecified: Secondary | ICD-10-CM

## 2024-07-27 LAB — POCT INFLUENZA A/B
Influenza A, POC: NEGATIVE
Influenza B, POC: NEGATIVE

## 2024-07-27 MED ORDER — FLEET ENEMA RE ENEM: 1.0000 | ENEMA | Freq: Every day | RECTAL | 0 refills | Status: AC | PRN

## 2024-07-27 NOTE — ED Triage Notes (Addendum)
 Pt c/o woke in the 3am with right side abd pain, HA and fever-had a virtual visit and was advised to be seen -states sx lasted until ~12pm-states he feels better and no fever-also c/o constipation x 3 days-NAD-steady gait

## 2024-07-27 NOTE — Discharge Instructions (Addendum)
 We will manage this as a viral syndrome. For sore throat or cough try using a honey-based tea. Use 3 teaspoons of honey with juice squeezed from half lemon. Place shaved pieces of ginger into 1/2-1 cup of water and warm over stove top. Then mix the ingredients and repeat every 4 hours as needed. Please take ibuprofen  600mg  every 6 hours with food alternating with OR taken together with Tylenol  500mg -650mg  every 6 hours for throat pain, fevers, aches and pains. Hydrate very well with at least 2 liters of water. Eat light meals such as soups (chicken and noodles, vegetable, chicken and wild rice).  Do not eat foods that you are allergic to.  Taking an antihistamine like Zyrtec (10mg  daily) can help against postnasal drainage, sinus congestion which can cause sinus pain, sinus headaches, throat pain, painful swallowing, coughing.  Use a fleet enema for your constipation.  Eat plenty of fiber.

## 2024-07-27 NOTE — Progress Notes (Signed)
" ° °  Thank you for the details you included in the comment boxes. Those details are very helpful in determining the best course of treatment for you and help us  to provide the best care.Because of need to assess further, especially giving recent illness and current constipation, we recommend that you schedule a Virtual Urgent Care video visit in order for the provider to better assess what is going on.  The provider will be able to give you a more accurate diagnosis and treatment plan if we can more freely discuss your symptoms and with the addition of a virtual examination.   If you change your visit to a video visit, we will bill your insurance (similar to an office visit) and you will not be charged for this e-Visit. You will be able to stay at home and speak with the first available [facility] advanced practice provider. The link to do a video visit is in the drop down Menu tab of your Welcome screen in MyChart.    "

## 2024-07-27 NOTE — Progress Notes (Signed)
 " Virtual Visit Consent   Chad Braun, you are scheduled for a virtual visit with a [facility] provider today. Just as with appointments in the office, your consent must be obtained to participate. Your consent will be active for this visit and any virtual visit you may have with one of our providers in the next 365 days. If you have a MyChart account, a copy of this consent can be sent to you electronically.  As this is a virtual visit, video technology does not allow for your provider to perform a traditional examination. This may limit your provider's ability to fully assess your condition. If your provider identifies any concerns that need to be evaluated in person or the need to arrange testing (such as labs, EKG, etc.), we will make arrangements to do so. Although advances in technology are sophisticated, we cannot ensure that it will always work on either your end or our end. If the connection with a video visit is poor, the visit may have to be switched to a telephone visit. With either a video or telephone visit, we are not always able to ensure that we have a secure connection.  By engaging in this virtual visit, you consent to the provision of healthcare and authorize for your insurance to be billed (if applicable) for the services provided during this visit. Depending on your insurance coverage, you may receive a charge related to this service.  I need to obtain your verbal consent now. Are you willing to proceed with your visit today? Chad Braun has provided verbal consent on 07/27/2024 for a virtual visit (video or telephone). Jon CHRISTELLA Belt, NP  Date: 07/27/2024 4:44 PM   Virtual Visit via Video Note   I, Jon CHRISTELLA Belt, connected with  Chad Braun  ([id number], 12/30/1965) on 07/27/24 at  4:30 PM EST by a video-enabled telemedicine application and verified that I am speaking with the correct person using two identifiers.  Location: Patient: Virtual Visit Location  Patient: Home Provider: Virtual Visit Location Provider: Home Office   I discussed the limitations of evaluation and management by telemedicine and the availability of in person appointments. The patient expressed understanding and agreed to proceed.    History of Present Illness: Chad Braun is a 59 y.o. who identifies as a male who was assigned male at birth, and is being seen today for illness  Tx for giardia 2 weeks ago, felt completely better. New sx now started last night. Woke up in middle of night with headache, body aches, felt hot, no temp at that time. Temp this morning 100.47F.    Extremely fatigued. Denies any respiratory/flu like sx.  Some nausea, no vomiting.   Has rested all day, took dayquil, feels a little better, temp down to normal. Still has a headache, pressure.   No bowel movement in 3 days, is passing very bad smelling gas. Bowel movements had returned to normal after giardia treatment. Taking miralax  daily for last 2 days with no results. Has had bad reflux last 2 nights, has hx of eosinophilic esophagitis.   Has RUQ abd pain.   Has hx duodenal ulcer  HPI: HPI  Problems:  Patient Active Problem List   Diagnosis Date Noted   Acute appendicitis 11/08/2015   Paresthesia and pain of left extremity 08/02/2015   Atrial fibrillation (HCC) 02/19/2011   Asymptomatic LV dysfunction 02/19/2011   Chronic anticoagulation 02/19/2011    Allergies: Allergies[1] Medications: Current Medications[2]  Observations/Objective: Patient is well-developed,  well-nourished in no acute distress.  Resting comfortably  at home.  Head is normocephalic, atraumatic.  No labored breathing.  Speech is clear and coherent with logical content.  Patient is alert and oriented at baseline.    Assessment and Plan: 1. Right upper quadrant abdominal pain (Primary)  2. Fever, unspecified fever cause  I rec in person eval. Ok to start with urgent care. Concerned about fever +  localized abd pain  Follow Up Instructions: I discussed the assessment and treatment plan with the patient. The patient was provided an opportunity to ask questions and all were answered. The patient agreed with the plan and demonstrated an understanding of the instructions.  A copy of instructions were sent to the patient via MyChart unless otherwise noted below.   The patient was advised to call back or seek an in-person evaluation if the symptoms worsen or if the condition fails to improve as anticipated.    Jon CHRISTELLA Belt, NP    [1] No Known Allergies [2]  Current Outpatient Medications:    Multiple Vitamins-Minerals (MULTIVITAMIN ADULTS PO), Take 1 tablet by mouth every morning., Disp: , Rfl:    omeprazole (PRILOSEC) 40 MG capsule, Take 40 mg by mouth daily., Disp: , Rfl:   "

## 2024-07-27 NOTE — ED Provider Notes (Signed)
 " [facility], Television/film/[facility] - URG[facility] C[facility]  Note:  This document was prepared using Conservation officer, historic buildings and may include unintentional dictation errors.  MRN: [id number] DOB: 05-03-1966  Subjective:   Chad Braun is a 59 y.o. male presenting for acute onset over night of posterior headache, fever (highest 100.21F), lower abdominal pain, constipation for 3 days.  Has used MiraLAX  once daily for the past couple of days.  No diarrhea, nausea, vomiting, chest pain, shob, wheezing, runny or stuffy nose. No smoking of any kind including cigarettes, cigars, vaping, marijuana use.  Patient was treated for Giardia earlier this month.  He took the antibiotic course prescribed and completely recovered.  Felt fine until symptoms started overnight.  He has had bouts of constipation over the years.  Has a history of atrial fibrillation but has been in sinus rhythm for years.  Has a history of an appendectomy.  Current Outpatient Medications  Medication Instructions   Multiple Vitamins-Minerals (MULTIVITAMIN ADULTS PO) 1 tablet, Every morning   omeprazole (PRILOSEC) 40 mg, Daily    Allergies[1]  Past Medical History:  Diagnosis Date   GERD (gastroesophageal reflux disease)    IBS (irritable bowel syndrome)    Mild tricuspid regurgitation    Persistent atrial fibrillation [facility])      Past Surgical History:  Procedure Laterality Date   ANKLE FRACTURE SURGERY Right 1998   APPENDECTOMY  11/08/2015   ATRIAL FIBRILLATION ABLATION N/A 07/07/2012   Procedure: ATRIAL FIBRILLATION ABLATION;  Surgeon: Lynwood Rakers, MD;  Location: [facility] CATH LAB;  Service: Cardiovascular;  Laterality: N/A;   CARDIAC CATHETERIZATION  01/21/2011   CARDIOVERSION  05/2012   unsuccessful   LAPAROSCOPIC APPENDECTOMY N/A 11/08/2015   Procedure: APPENDECTOMY LAPAROSCOPIC;  Surgeon: Lynwood Pina, MD;  Location: [facility] OR;  Service: General;  Laterality: N/A;   TEE WITHOUT CARDIOVERSION  07/07/2012   Procedure: TRANSESOPHAGEAL  ECHOCARDIOGRAM (TEE);  Surgeon: Toribio JONELLE Fuel, MD;  Location: [facility] ENDOSCOPY;  Service: Cardiovascular;  Laterality: N/A;   TIBIA FRACTURE SURGERY Right 1998    Family History  Problem Relation Age of Onset   Breast cancer Mother    Lung cancer Mother    Colon cancer Father     Social History   Occupational History    Employer: [employer]    Comment: Engineer, structural  Tobacco Use   Smoking status: Never   Smokeless tobacco: Never   Tobacco comments:    Never smoked 05/06/24  Vaping Use   Vaping status: Never Used  Substance and Sexual Activity   Alcohol use: Not Currently   Drug use: No   Sexual activity: Yes     ROS   Objective:   Vitals: BP 117/75 (BP Location: Left Arm)   Pulse 71   Temp 97.7 F (36.5 C) (Oral)   Resp 16   SpO2 99%   Physical Exam Constitutional:      General: He is not in acute distress.    Appearance: Normal appearance. He is well-developed and normal weight. He is not ill-appearing, toxic-appearing or diaphoretic.  HENT:     Head: Normocephalic and atraumatic.     Right Ear: External ear normal.     Left Ear: External ear normal.     Nose: Nose normal.     Mouth/Throat:     Mouth: Mucous membranes are moist.  Eyes:     General: Lids are everted, no foreign bodies appreciated. No scleral icterus.       Right eye:  No foreign body, discharge or hordeolum.        Left eye: No foreign body, discharge or hordeolum.     Extraocular Movements: Extraocular movements intact.     Conjunctiva/sclera: Conjunctivae normal.     Right eye: Right conjunctiva is not injected. No chemosis, exudate or hemorrhage.    Left eye: Left conjunctiva is not injected. No chemosis, exudate or hemorrhage.    Pupils: Pupils are equal, round, and reactive to light.  Cardiovascular:     Rate and Rhythm: Normal rate and regular rhythm.     Heart sounds: Normal heart sounds. No murmur heard.    No friction rub. No gallop.  Pulmonary:      Effort: Pulmonary effort is normal. No respiratory distress.     Breath sounds: Normal breath sounds. No stridor. No wheezing, rhonchi or rales.  Abdominal:     General: Bowel sounds are normal. There is no distension.     Palpations: Abdomen is soft. There is no mass.     Tenderness: There is abdominal tenderness in the right lower quadrant and periumbilical area. There is no right CVA tenderness, left CVA tenderness, guarding or rebound.  Musculoskeletal:     Cervical back: Normal range of motion.  Neurological:     Mental Status: He is alert and oriented to person, place, and time.     Cranial Nerves: No cranial nerve deficit.     Motor: No weakness.     Coordination: Coordination normal.     Gait: Gait normal.  Psychiatric:        Mood and Affect: Mood normal.        Behavior: Behavior normal.        Thought Content: Thought content normal.        Judgment: Judgment normal.    Negative Flu A and Flu B.   Assessment and Plan :   PDMP not reviewed this encounter.  1. Acute viral syndrome   2. Body aches   3. Constipation, unspecified constipation type      Suspect viral syndrome. Physical exam findings reassuring and vital signs stable for discharge. Advised supportive care, offered symptomatic relief.  Increase fiber, hydrate consistently.  Use a Fleet enema for constipation.  No signs of an acute abdomen or an acute encephalopathy.  Counseled patient on potential for adverse effects with medications prescribed/recommended today, ER and return-to-clinic precautions discussed, patient verbalized understanding.      [1] No Known Allergies    Christopher Savannah, PA-C 07/27/24 1810  "

## 2024-07-27 NOTE — Patient Instructions (Signed)
" °  Chad Braun, thank you for joining Jon CHRISTELLA Belt, NP for today's virtual visit.  While this provider is not your primary care provider (PCP), if your PCP is located in our provider database this encounter information will be shared with them immediately following your visit.   A [facility] MyChart account gives you access to today's visit and all your visits, tests, and labs performed at [facility]  click here if you don't have a [facility] MyChart account or go to [email or web address]  Consent: (Patient) Chad Braun provided verbal consent for this virtual visit at the beginning of the encounter.  Current Medications:  Current Outpatient Medications:    Multiple Vitamins-Minerals (MULTIVITAMIN ADULTS PO), Take 1 tablet by mouth every morning., Disp: , Rfl:    omeprazole (PRILOSEC) 40 MG capsule, Take 40 mg by mouth daily., Disp: , Rfl:    Medications ordered in this encounter:  No orders of the defined types were placed in this encounter.    *If you need refills on other medications prior to your next appointment, please contact your pharmacy*  Follow-Up: Call back or seek an in-person evaluation if the symptoms worsen or if the condition fails to improve as anticipated.  [facility] [phone]  Other Instructions  Please be seen in person today. I am concerned about the abdominal pain plus fever.    If you have been instructed to have an in-person evaluation today at a local Urgent Care facility, please use the link below. It will take you to a list of all of our available [facility], including address, phone number and hours of operation. Please do not delay care.  [facility]  If you or a family member do not have a primary care provider, use the link below to schedule a visit and establish care. When you choose a [facility] primary care physician or advanced practice provider, you gain a long-term  partner in health. Find a Primary Care Provider  Learn more about [facility] in-office and virtual care options: [facility] - Get Care Now  "

## 2024-07-27 NOTE — Progress Notes (Signed)
 Message sent to patient requesting further input regarding current symptoms. Awaiting patient response.

## 2024-10-20 ENCOUNTER — Ambulatory Visit: Admitting: Family Medicine
